# Patient Record
Sex: Female | Born: 1995 | Race: White | Hispanic: No | Marital: Single | Smoking: Former smoker
Health system: Southern US, Community
[De-identification: ages and names within clinical notes are randomized; demographics above are authoritative.]

## PROBLEM LIST (undated history)

## (undated) DIAGNOSIS — F909 Attention-deficit hyperactivity disorder, unspecified type: Secondary | ICD-10-CM

## (undated) DIAGNOSIS — Z9189 Other specified personal risk factors, not elsewhere classified: Secondary | ICD-10-CM

## (undated) DIAGNOSIS — F32A Depression, unspecified: Secondary | ICD-10-CM

## (undated) DIAGNOSIS — F419 Anxiety disorder, unspecified: Secondary | ICD-10-CM

## (undated) DIAGNOSIS — G43909 Migraine, unspecified, not intractable, without status migrainosus: Secondary | ICD-10-CM

## (undated) DIAGNOSIS — F329 Major depressive disorder, single episode, unspecified: Secondary | ICD-10-CM

## (undated) DIAGNOSIS — R569 Unspecified convulsions: Secondary | ICD-10-CM

## (undated) DIAGNOSIS — J45909 Unspecified asthma, uncomplicated: Secondary | ICD-10-CM

## (undated) DIAGNOSIS — F319 Bipolar disorder, unspecified: Secondary | ICD-10-CM

## (undated) HISTORY — DX: Attention-deficit hyperactivity disorder, unspecified type: F90.9

## (undated) HISTORY — DX: Other specified personal risk factors, not elsewhere classified: Z91.89

## (undated) HISTORY — DX: Unspecified convulsions: R56.9

## (undated) HISTORY — DX: Major depressive disorder, single episode, unspecified: F32.9

## (undated) HISTORY — DX: Depression, unspecified: F32.A

## (undated) HISTORY — PX: TONSILLECTOMY AND ADENOIDECTOMY: SUR1326

## (undated) HISTORY — DX: Bipolar disorder, unspecified: F31.9

## (undated) HISTORY — DX: Migraine, unspecified, not intractable, without status migrainosus: G43.909

## (undated) HISTORY — DX: Anxiety disorder, unspecified: F41.9

---
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
## 2016-01-21 ENCOUNTER — Encounter: Payer: Self-pay | Admitting: *Deleted

## 2016-01-21 ENCOUNTER — Emergency Department
Admission: EM | Admit: 2016-01-21 | Discharge: 2016-01-21 | Disposition: A | Discharge status: Home / Self Care | Payer: Self-pay | Attending: Emergency Medicine | Admitting: Emergency Medicine

## 2016-01-21 DIAGNOSIS — F17200 Nicotine dependence, unspecified, uncomplicated: Secondary | ICD-10-CM | POA: Insufficient documentation

## 2016-01-21 DIAGNOSIS — K2960 Other gastritis without bleeding: Secondary | ICD-10-CM | POA: Insufficient documentation

## 2016-01-21 DIAGNOSIS — R1084 Generalized abdominal pain: Secondary | ICD-10-CM

## 2016-01-21 LAB — CBC
HCT: 45.6 % (ref 35.0–47.0)
HEMOGLOBIN: 15.6 g/dL (ref 12.0–16.0)
MCH: 30.9 pg (ref 26.0–34.0)
MCHC: 34.3 g/dL (ref 32.0–36.0)
MCV: 90.2 fL (ref 80.0–100.0)
PLATELETS: 203 10*3/uL (ref 150–440)
RBC: 5.06 MIL/uL (ref 3.80–5.20)
RDW: 13.2 % (ref 11.5–14.5)
WBC: 6.5 10*3/uL (ref 3.6–11.0)

## 2016-01-21 LAB — URINALYSIS COMPLETE WITH MICROSCOPIC (ARMC ONLY)
BILIRUBIN URINE: NEGATIVE
Glucose, UA: NEGATIVE mg/dL
Leukocytes, UA: NEGATIVE
NITRITE: NEGATIVE
PH: 6 (ref 5.0–8.0)
PROTEIN: 30 mg/dL — AB
SPECIFIC GRAVITY, URINE: 1.028 (ref 1.005–1.030)

## 2016-01-21 LAB — COMPREHENSIVE METABOLIC PANEL
ALK PHOS: 75 U/L (ref 38–126)
ALT: 23 U/L (ref 14–54)
ANION GAP: 9 (ref 5–15)
AST: 34 U/L (ref 15–41)
Albumin: 4.3 g/dL (ref 3.5–5.0)
BILIRUBIN TOTAL: 0.4 mg/dL (ref 0.3–1.2)
BUN: 14 mg/dL (ref 6–20)
CALCIUM: 8.9 mg/dL (ref 8.9–10.3)
CO2: 26 mmol/L (ref 22–32)
CREATININE: 0.77 mg/dL (ref 0.44–1.00)
Chloride: 100 mmol/L — ABNORMAL LOW (ref 101–111)
Glucose, Bld: 94 mg/dL (ref 65–99)
Potassium: 3.8 mmol/L (ref 3.5–5.1)
Sodium: 135 mmol/L (ref 135–145)
TOTAL PROTEIN: 7.2 g/dL (ref 6.5–8.1)

## 2016-01-21 LAB — POCT PREGNANCY, URINE: Preg Test, Ur: NEGATIVE

## 2016-01-21 LAB — LIPASE, BLOOD: Lipase: 17 U/L (ref 11–51)

## 2016-01-21 MED ORDER — GI COCKTAIL ~~LOC~~
30.0000 mL | ORAL | Status: AC
  Administered 2016-01-21: 30 mL via ORAL
  Filled 2016-01-21: qty 30

## 2016-01-21 MED ORDER — FAMOTIDINE 20 MG PO TABS
40.0000 mg | ORAL_TABLET | Freq: Once | ORAL | Status: AC
  Administered 2016-01-21: 40 mg via ORAL
  Filled 2016-01-21: qty 2

## 2016-01-21 MED ORDER — ALUMINUM-MAGNESIUM-SIMETHICONE 200-200-20 MG/5ML PO SUSP: 30.0000 mL | Freq: Three times a day (TID) | ORAL | 0 refills | Status: DC

## 2016-01-21 MED ORDER — METOCLOPRAMIDE HCL 10 MG PO TABS: 10.0000 mg | ORAL_TABLET | Freq: Four times a day (QID) | ORAL | 0 refills | Status: DC | PRN

## 2016-01-21 MED ORDER — ONDANSETRON 4 MG PO TBDP
8.0000 mg | ORAL_TABLET | Freq: Once | ORAL | Status: AC
  Administered 2016-01-21: 8 mg via ORAL
  Filled 2016-01-21: qty 2

## 2016-01-21 MED ORDER — KETOROLAC TROMETHAMINE 60 MG/2ML IM SOLN
15.0000 mg | Freq: Once | INTRAMUSCULAR | Status: AC
  Administered 2016-01-21: 15 mg via INTRAMUSCULAR
  Filled 2016-01-21: qty 2

## 2016-01-21 MED ORDER — OXYCODONE-ACETAMINOPHEN 5-325 MG PO TABS
1.0000 | ORAL_TABLET | Freq: Once | ORAL | Status: AC
  Administered 2016-01-21: 1 via ORAL
  Filled 2016-01-21: qty 1

## 2016-01-21 MED ORDER — FAMOTIDINE 20 MG PO TABS: 20.0000 mg | ORAL_TABLET | Freq: Two times a day (BID) | ORAL | 0 refills | Status: DC

## 2016-01-21 NOTE — ED Notes (Signed)
Pt unable to void at this time. 

## 2016-01-21 NOTE — ED Triage Notes (Signed)
Pt to triage via wheelchair.  Pt reports upper abd pain.  Pt states vomited yesterday.  Pt tearful in triage.

## 2016-01-21 NOTE — ED Provider Notes (Signed)
[facility] Emergency Department Provider Note  ____________________________________________  Time seen: Approximately 8:24 PM  I have reviewed the triage vital signs and the nursing notes.   HISTORY  Chief Complaint Abdominal Pain    HPI Chloe Hopkins is a 20 y.o. female who complains of upper abdominal pain with vomiting since yesterday. He reports that she has decreased appetite and unable to eat or drink because of the discomfort. Status post cholecystectomy from several years ago. No fevers or chills. Family members sick with viral illnesses recently. No diarrhea.     No past medical history on file. None  There are no active problems to display for this patient.    No past surgical history on file. Cholecystectomy  Prior to Admission medications   Medication Sig Start Date End Date Taking? Authorizing Provider  aluminum-magnesium hydroxide-simethicone (MAALOX) 200-200-20 MG/5ML SUSP Take 30 mLs by mouth 4 (four) times daily -  before meals and at bedtime. 01/21/16   Sharman CheekPhillip Hermine Feria, MD  famotidine (PEPCID) 20 MG tablet Take 1 tablet (20 mg total) by mouth 2 (two) times daily. 01/21/16   Sharman CheekPhillip Caleesi Kohl, MD  metoCLOPramide (REGLAN) 10 MG tablet Take 1 tablet (10 mg total) by mouth every 6 (six) hours as needed. 01/21/16   Sharman CheekPhillip Jarrel Knoke, MD     Allergies Review of patient's allergies indicates no known allergies.   No family history on file.  Social History Social History  Substance Use Topics  . Smoking status: Current Every Day Smoker  . Smokeless tobacco: Never Used  . Alcohol use No    Review of Systems  Constitutional:   No fever or chills.  ENT:   No sore throat. No rhinorrhea. Cardiovascular:   No chest pain. Respiratory:   No dyspnea or cough. Gastrointestinal:   Positive upper abdominal pain with vomiting. No diarrhea. Genitourinary:   Negative for dysuria or difficulty urinating. 10-point ROS otherwise  negative.  ____________________________________________   PHYSICAL EXAM:  VITAL SIGNS: ED Triage Vitals  Enc Vitals Group     BP 01/21/16 1924 119/77     Pulse Rate 01/21/16 1924 83     Resp 01/21/16 1924 18     Temp 01/21/16 1924 98.2 F (36.8 C)     Temp Source 01/21/16 1924 Oral     SpO2 01/21/16 1924 100 %     Weight 01/21/16 1925 133 lb (60.3 kg)     Height 01/21/16 1925 5' (1.524 m)     Head Circumference --      Peak Flow --      Pain Score 01/21/16 1925 9     Pain Loc --      Pain Edu? --      Excl. in GC? --     Vital signs reviewed, nursing assessments reviewed.   Constitutional:   Alert and oriented. Well appearing and in no distress. Eyes:   No scleral icterus. No conjunctival pallor. PERRL. EOMI.  No nystagmus. ENT   Head:   Normocephalic and atraumatic.   Nose:   No congestion/rhinnorhea. No septal hematoma   Mouth/Throat:   MMM, no pharyngeal erythema. No peritonsillar mass.    Neck:   No stridor. No SubQ emphysema. No meningismus. Hematological/Lymphatic/Immunilogical:   No cervical lymphadenopathy. Cardiovascular:   RRR. Symmetric bilateral radial and DP pulses.  No murmurs.  Respiratory:   Normal respiratory effort without tachypnea nor retractions. Breath sounds are clear and equal bilaterally. No wheezes/rales/rhonchi. Gastrointestinal:   Soft with left upper quadrant and  epigastric tenderness. Non distended. There is no CVA tenderness.  No rebound, rigidity, or guarding. Genitourinary:   deferred Musculoskeletal:   Nontender with normal range of motion in all extremities. No joint effusions.  No lower extremity tenderness.  No edema. Neurologic:   Normal speech and language.  CN 2-10 normal. Motor grossly intact. No gross focal neurologic deficits are appreciated.  Skin:    Skin is warm, dry and intact. No rash noted.  No petechiae, purpura, or bullae.  ____________________________________________    LABS (pertinent  positives/negatives) (all labs ordered are listed, but only abnormal results are displayed) Labs Reviewed  COMPREHENSIVE METABOLIC PANEL - Abnormal; Notable for the following:       Result Value   Chloride 100 (*)    All other components within normal limits  URINALYSIS COMPLETEWITH MICROSCOPIC ([facility] ONLY) - Abnormal; Notable for the following:    Color, Urine YELLOW (*)    APPearance CLOUDY (*)    Ketones, ur TRACE (*)    Hgb urine dipstick 1+ (*)    Protein, ur 30 (*)    Bacteria, UA RARE (*)    Squamous Epithelial / LPF 6-30 (*)    All other components within normal limits  LIPASE, BLOOD  CBC  POC URINE PREG, ED  POCT PREGNANCY, URINE   ____________________________________________   EKG    ____________________________________________    RADIOLOGY    ____________________________________________   PROCEDURES Procedures  ____________________________________________   INITIAL IMPRESSION / ASSESSMENT AND PLAN / ED COURSE  Pertinent labs & imaging results that were available during my care of the patient were reviewed by me and considered in my medical decision making (see chart for details).  Patient presents with upper abdominal pain, likely gastritis, viral in origin. Labs unremarkable. Lipase normal. Urinalysis and pregnancy test negative. Patient given antiemetics, tolerating oral intake. Inadequate pain relief with Toradol, so gave her Percocet to assist with her acute symptoms tonight. GI cocktail Pepcid as well. We'll discharge home with famotidine Maalox and Reglan. Follow up with primary care.   Considering the patient's symptoms, medical history, and physical examination today, I have low suspicion for cholecystitis or biliary pathology, pancreatitis, perforation or bowel obstruction, hernia, intra-abdominal abscess, AAA or dissection, volvulus or intussusception, mesenteric ischemia, or appendicitis.       Clinical Course    ____________________________________________   FINAL CLINICAL IMPRESSION(S) / ED DIAGNOSES  Final diagnoses:  Generalized abdominal pain  Other specified gastritis, presence of bleeding unspecified, unspecified chronicity       Portions of this note were generated with dragon dictation software. Dictation errors may occur despite best attempts at proofreading.    Sharman CheekPhillip Damarys Speir, MD 01/21/16 2155

## 2016-04-20 DIAGNOSIS — R197 Diarrhea, unspecified: Secondary | ICD-10-CM | POA: Diagnosis not present

## 2016-04-20 DIAGNOSIS — J45909 Unspecified asthma, uncomplicated: Secondary | ICD-10-CM | POA: Diagnosis not present

## 2016-04-20 DIAGNOSIS — R1032 Left lower quadrant pain: Secondary | ICD-10-CM | POA: Diagnosis not present

## 2016-04-20 DIAGNOSIS — R112 Nausea with vomiting, unspecified: Secondary | ICD-10-CM | POA: Insufficient documentation

## 2016-04-20 DIAGNOSIS — F17200 Nicotine dependence, unspecified, uncomplicated: Secondary | ICD-10-CM | POA: Insufficient documentation

## 2016-04-20 DIAGNOSIS — R1031 Right lower quadrant pain: Secondary | ICD-10-CM | POA: Diagnosis not present

## 2016-04-20 DIAGNOSIS — N898 Other specified noninflammatory disorders of vagina: Secondary | ICD-10-CM | POA: Diagnosis not present

## 2016-04-20 DIAGNOSIS — K6289 Other specified diseases of anus and rectum: Secondary | ICD-10-CM | POA: Diagnosis not present

## 2016-04-20 NOTE — ED Triage Notes (Signed)
Patient reports abdominal pain and vomiting that started tonight.

## 2016-04-21 ENCOUNTER — Encounter: Payer: Self-pay | Admitting: Radiology

## 2016-04-21 ENCOUNTER — Emergency Department: Payer: BLUE CROSS/BLUE SHIELD

## 2016-04-21 ENCOUNTER — Emergency Department
Admission: EM | Admit: 2016-04-21 | Discharge: 2016-04-21 | Disposition: A | Discharge status: Home / Self Care | Payer: BLUE CROSS/BLUE SHIELD | Attending: Emergency Medicine | Admitting: Emergency Medicine

## 2016-04-21 DIAGNOSIS — R197 Diarrhea, unspecified: Secondary | ICD-10-CM

## 2016-04-21 DIAGNOSIS — R109 Unspecified abdominal pain: Secondary | ICD-10-CM

## 2016-04-21 DIAGNOSIS — R112 Nausea with vomiting, unspecified: Secondary | ICD-10-CM

## 2016-04-21 HISTORY — DX: Unspecified asthma, uncomplicated: J45.909

## 2016-04-21 LAB — COMPREHENSIVE METABOLIC PANEL
ALBUMIN: 4.2 g/dL (ref 3.5–5.0)
ALT: 22 U/L (ref 14–54)
ANION GAP: 8 (ref 5–15)
AST: 27 U/L (ref 15–41)
Alkaline Phosphatase: 57 U/L (ref 38–126)
BUN: 18 mg/dL (ref 6–20)
CALCIUM: 8.7 mg/dL — AB (ref 8.9–10.3)
CHLORIDE: 105 mmol/L (ref 101–111)
CO2: 24 mmol/L (ref 22–32)
Creatinine, Ser: 0.6 mg/dL (ref 0.44–1.00)
GFR calc non Af Amer: >60 mL/min (ref >60)
GLUCOSE: 130 mg/dL — AB (ref 65–99)
POTASSIUM: 4.1 mmol/L (ref 3.5–5.1)
SODIUM: 137 mmol/L (ref 135–145)
Total Bilirubin: 0.7 mg/dL (ref 0.3–1.2)
Total Protein: 7.2 g/dL (ref 6.5–8.1)

## 2016-04-21 LAB — HCG, QUANTITATIVE, PREGNANCY

## 2016-04-21 LAB — URINALYSIS, COMPLETE (UACMP) WITH MICROSCOPIC
Bilirubin Urine: NEGATIVE
GLUCOSE, UA: NEGATIVE mg/dL
KETONES UR: NEGATIVE mg/dL
Nitrite: NEGATIVE
PROTEIN: 100 mg/dL — AB
Specific Gravity, Urine: 1.03 (ref 1.005–1.030)
pH: 5 (ref 5.0–8.0)

## 2016-04-21 LAB — CBC
HEMATOCRIT: 43.3 % (ref 35.0–47.0)
HEMOGLOBIN: 15.1 g/dL (ref 12.0–16.0)
MCH: 30.7 pg (ref 26.0–34.0)
MCHC: 34.9 g/dL (ref 32.0–36.0)
MCV: 87.8 fL (ref 80.0–100.0)
Platelets: 219 10*3/uL (ref 150–440)
RBC: 4.93 MIL/uL (ref 3.80–5.20)
RDW: 13.2 % (ref 11.5–14.5)
WBC: 16.4 10*3/uL — ABNORMAL HIGH (ref 3.6–11.0)

## 2016-04-21 LAB — WET PREP, GENITAL
SPERM: NONE SEEN
TRICH WET PREP: NONE SEEN
YEAST WET PREP: NONE SEEN

## 2016-04-21 LAB — CHLAMYDIA/NGC RT PCR (ARMC ONLY)
Chlamydia Tr: NOT DETECTED
N gonorrhoeae: NOT DETECTED

## 2016-04-21 LAB — POCT PREGNANCY, URINE: Preg Test, Ur: NEGATIVE

## 2016-04-21 LAB — LIPASE, BLOOD: LIPASE: 25 U/L (ref 11–51)

## 2016-04-21 MED ORDER — SODIUM CHLORIDE 0.9 % IV BOLUS (SEPSIS)
1000.0000 mL | Freq: Once | INTRAVENOUS | Status: AC
  Administered 2016-04-21: 1000 mL via INTRAVENOUS

## 2016-04-21 MED ORDER — IOPAMIDOL (ISOVUE-300) INJECTION 61%
30.0000 mL | Freq: Once | INTRAVENOUS | Status: AC
  Administered 2016-04-21: 30 mL via ORAL

## 2016-04-21 MED ORDER — MORPHINE SULFATE (PF) 4 MG/ML IV SOLN
4.0000 mg | Freq: Once | INTRAVENOUS | Status: AC
  Administered 2016-04-21: 4 mg via INTRAVENOUS
  Filled 2016-04-21: qty 1

## 2016-04-21 MED ORDER — ONDANSETRON 4 MG PO TBDP
4.0000 mg | ORAL_TABLET | Freq: Once | ORAL | Status: AC
  Administered 2016-04-21: 4 mg via ORAL
  Filled 2016-04-21: qty 1

## 2016-04-21 MED ORDER — ONDANSETRON 4 MG PO TBDP
4.0000 mg | ORAL_TABLET | Freq: Once | ORAL | Status: AC
  Administered 2016-04-21: 4 mg via ORAL

## 2016-04-21 MED ORDER — ONDANSETRON 4 MG PO TBDP
ORAL_TABLET | ORAL | Status: AC
  Filled 2016-04-21: qty 1

## 2016-04-21 MED ORDER — ONDANSETRON HCL 4 MG PO TABS: 4.0000 mg | ORAL_TABLET | Freq: Three times a day (TID) | ORAL | 0 refills | Status: DC | PRN

## 2016-04-21 MED ORDER — IOPAMIDOL (ISOVUE-300) INJECTION 61%
100.0000 mL | Freq: Once | INTRAVENOUS | Status: AC | PRN
  Administered 2016-04-21: 100 mL via INTRAVENOUS

## 2016-04-21 MED ORDER — OXYCODONE-ACETAMINOPHEN 5-325 MG PO TABS
1.0000 | ORAL_TABLET | Freq: Once | ORAL | Status: AC
Start: 2016-04-21 — End: 2016-04-21
  Administered 2016-04-21: 1 via ORAL
  Filled 2016-04-21: qty 1

## 2016-04-21 NOTE — ED Notes (Signed)
Pt vomiting in lobby; given Zofran as allowed per protocol

## 2016-04-21 NOTE — ED Notes (Signed)
Pt nauseous, Dr Alphonzo LemmingsMcShane notified, Clydie BraunKaren, CT called to scan with one bottle drank  Mother going home, Roma Theda BelfastChildress [phone]

## 2016-04-21 NOTE — ED Provider Notes (Signed)
[facility] Emergency Department Provider Note  ____________________________________________   I have reviewed the triage vital signs and the nursing notes.   HISTORY  Chief Complaint Abdominal Pain and Emesis    HPI Chloe Hopkins is a 21 y.o. female who present today complaining of lower abdominal pain. Began this evening with vomiting. Has not had loose stools. The cramping diffuse discomfort. More on the right than the left. Denies vaginal discharge. On her menstrual period right now. Denies pregnancy. Has not had sexual relations in over a month. No fever. Anus crampy and diffuse but more on the right than the left. She is on her menstrual period now. It is a normal menstrual period.     Past Medical History:  Diagnosis Date  . Asthma     There are no active problems to display for this patient.   No past surgical history on file.  Prior to Admission medications   Medication Sig Start Date End Date Taking? Authorizing Provider  aluminum-magnesium hydroxide-simethicone (MAALOX) 200-200-20 MG/5ML SUSP Take 30 mLs by mouth 4 (four) times daily -  before meals and at bedtime. Patient not taking: Reported on 04/21/2016 01/21/16   Sharman CheekPhillip Stafford, MD  famotidine (PEPCID) 20 MG tablet Take 1 tablet (20 mg total) by mouth 2 (two) times daily. Patient not taking: Reported on 04/21/2016 01/21/16   Sharman CheekPhillip Stafford, MD    Allergies Patient has no known allergies.  No family history on file.  Social History Social History  Substance Use Topics  . Smoking status: Current Every Day Smoker  . Smokeless tobacco: Never Used  . Alcohol use No    Review of Systems Constitutional: No fever/chills Eyes: No visual changes. ENT: No sore throat. No stiff neck no neck pain Cardiovascular: Denies chest pain. Respiratory: Denies shortness of breath. Gastrointestinal:   As above vomiting.  No diarrhea.  No constipation. Genitourinary: Negative for  dysuria. Musculoskeletal: Negative lower extremity swelling Skin: Negative for rash. Neurological: Negative for severe headaches, focal weakness or numbness. 10-point ROS otherwise negative.  ____________________________________________   PHYSICAL EXAM:  VITAL SIGNS: ED Triage Vitals  Enc Vitals Group     BP 04/20/16 2350 108/64     Pulse Rate 04/20/16 2350 (!) 103     Resp 04/20/16 2350 20     Temp 04/20/16 2350 98.2 F (36.8 C)     Temp src --      SpO2 04/20/16 2350 97 %     Weight 04/20/16 2348 141 lb (64 kg)     Height 04/20/16 2348 5\' 1"  (1.549 m)     Head Circumference --      Peak Flow --      Pain Score 04/20/16 2350 8     Pain Loc --      Pain Edu? --      Excl. in GC? --     Constitutional: Alert and oriented. Well appearing and in no acute distress. Eyes: Conjunctivae are normal. PERRL. EOMI. Head: Atraumatic. Nose: No congestion/rhinnorhea. Mouth/Throat: Mucous membranes are moist.  Oropharynx non-erythematous. Neck: No stridor.   Nontender with no meningismus Cardiovascular: Normal rate, regular rhythm. Grossly normal heart sounds.  Good peripheral circulation. Respiratory: Normal respiratory effort.  No retractions. Lungs CTAB. Abdominal: Soft and Tender noted to the right lower quadrant around McBurney's point no guarding or rebound No distention. No guarding no rebound Back:  There is no focal tenderness or step off.  there is no midline tenderness there are no lesions noted.  there is no CVA tenderness Pelvic exam: Female nurse chaperone present, no external lesions noted, physiologic vaginal discharge noted with no purulent discharge, no cervical motion tenderness, no adnexal tenderness or mass, there is no significant uterine tenderness or mass. No vaginal bleeding Musculoskeletal: No lower extremity tenderness, no upper extremity tenderness. No joint effusions, no DVT signs strong distal pulses no edema Neurologic:  Normal speech and language. No gross  focal neurologic deficits are appreciated.  Skin:  Skin is warm, dry and intact. No rash noted. Psychiatric: Mood and affect are normal. Speech and behavior are normal.  ____________________________________________   LABS (all labs ordered are listed, but only abnormal results are displayed)  Labs Reviewed  COMPREHENSIVE METABOLIC PANEL - Abnormal; Notable for the following:       Result Value   Glucose, Bld 130 (*)    Calcium 8.7 (*)    All other components within normal limits  CBC - Abnormal; Notable for the following:    WBC 16.4 (*)    All other components within normal limits  URINALYSIS, COMPLETE (UACMP) WITH MICROSCOPIC - Abnormal; Notable for the following:    Color, Urine AMBER (*)    APPearance CLEAR (*)    Hgb urine dipstick LARGE (*)    Protein, ur 100 (*)    Leukocytes, UA TRACE (*)    Bacteria, UA RARE (*)    Squamous Epithelial / LPF 0-5 (*)    All other components within normal limits  CHLAMYDIA/NGC RT PCR ([facility] ONLY)  URINE CULTURE  WET PREP, GENITAL  LIPASE, BLOOD  HCG, QUANTITATIVE, PREGNANCY  POC URINE PREG, ED  POCT PREGNANCY, URINE   ____________________________________________  EKG  I personally interpreted any EKGs ordered by me or triage  ____________________________________________  RADIOLOGY  I reviewed any imaging ordered by me or triage that were performed during my shift and, if possible, patient and/or family made aware of any abnormal findings. ____________________________________________   PROCEDURES  Procedure(s) performed: None  Procedures  Critical Care performed: None  ____________________________________________   INITIAL IMPRESSION / ASSESSMENT AND PLAN / ED COURSE  Pertinent labs & imaging results that were available during my care of the patient were reviewed by me and considered in my medical decision making (see chart for details).  When a nonsurgical abdomen however had focal abdominal pain at  McBurney's point with a white count and nausea. We obtained a CT scan to rule out appendicitis. That was reassuringly negative for possible viral pathology noted. Exam shows no evidence of PID. Wet prep is pending. She has a tiny ovarian cyst which I do not think is likely the cause of her symptoms although it certainly could be no evidence of torsion with a cyst seen on CT of less than a centimeter. Patient would prefer not to have an ultrasound at this time. If her pelvic exam results are reassuring in terms of what prep we'll discharge her home with close outpatient follow-up. Most likely this is early viral gastroenteritis. Abdomen completely nontender and benign at this time.    ____________________________________________   FINAL CLINICAL IMPRESSION(S) / ED DIAGNOSES  Final diagnoses:  None      This chart was dictated using voice recognition software.  Despite best efforts to proofread,  errors can occur which can change meaning.      Jeanmarie Plant, MD 04/21/16 [phone]

## 2016-04-22 LAB — URINE CULTURE

## 2016-05-03 ENCOUNTER — Encounter: Payer: Self-pay | Admitting: Physician Assistant

## 2016-05-03 ENCOUNTER — Ambulatory Visit (INDEPENDENT_AMBULATORY_CARE_PROVIDER_SITE_OTHER): Payer: BLUE CROSS/BLUE SHIELD | Admitting: Physician Assistant

## 2016-05-03 VITALS — BP 110/82 | HR 92 | Temp 97.9°F | Resp 16 | Ht 61.0 in | Wt 143.0 lb

## 2016-05-03 DIAGNOSIS — Z7689 Persons encountering health services in other specified circumstances: Secondary | ICD-10-CM

## 2016-05-03 DIAGNOSIS — F3112 Bipolar disorder, current episode manic without psychotic features, moderate: Secondary | ICD-10-CM

## 2016-05-03 DIAGNOSIS — N83201 Unspecified ovarian cyst, right side: Secondary | ICD-10-CM

## 2016-05-03 MED ORDER — LAMOTRIGINE 25 MG PO TABS: 25.0000 mg | ORAL_TABLET | Freq: Every day | ORAL | 0 refills | Status: DC

## 2016-05-03 NOTE — Progress Notes (Signed)
Patient: Chloe Hopkins Female    DOB: [birth year]   21 y.o.   MRN: [id number] Visit Date: 05/03/2016  Today's Provider: Margaretann LovelessJennifer M Baylen Dea, PA-C   Chief Complaint  Patient presents with  . New Patient (Initial Visit)  . ADHD   Subjective:    HPI Patient here today to establish care. Patient recently moved from [place]. Patient's mother reports that pt is UTD on all vaccines.  [facility]/21/16 for abdominal pain. Patient had a CT abdomen pelivs w/ contrast done. Patient was told to have a cyst on one of her ovaries and to fu with PCP.  Patient here with mother today. Patient's mother reports that patient was on Bipolar medication Pristiq 50 mg BID for about 4 years. She has been off medications for a while now. She and her mother both report she is having more frequent swings now. She reports being in manic states more than depressed states at this time.     No Known Allergies  No current outpatient prescriptions on file.  Review of Systems  Constitutional: Positive for activity change and fatigue. Negative for fever.  HENT: Negative.   Eyes: Negative.   Respiratory: Negative.   Cardiovascular: Negative.   Gastrointestinal: Negative.   Endocrine: Negative.   Genitourinary: Negative.   Musculoskeletal: Negative.   Skin: Negative.   Allergic/Immunologic: Negative.   Neurological: Positive for headaches. Negative for dizziness and light-headedness.  Hematological: Negative.   Psychiatric/Behavioral: Positive for agitation, behavioral problems and decreased concentration. Negative for confusion, dysphoric mood, hallucinations, self-injury, sleep disturbance and suicidal ideas. The patient is hyperactive. The patient is not nervous/anxious.     Social History  Substance Use Topics  . Smoking status: Current Every Day Smoker  . Smokeless tobacco: Never Used  . Alcohol use No   Objective:   BP 110/82 (BP Location: Left Arm, Patient Position:  Sitting, Cuff Size: Normal)   Pulse 92   Temp 97.9 F (36.6 C) (Oral)   Resp 16   Ht 5\' 1"  (1.549 m)   Wt 143 lb (64.9 kg)   LMP 04/20/2016 (Exact Date) Comment: neg preg test today  BMI 27.02 kg/m   Physical Exam  Constitutional: She is oriented to person, place, and time. She appears well-developed and well-nourished. No distress.  HENT:  Head: Normocephalic and atraumatic.  Right Ear: Tympanic membrane, external ear and ear canal normal.  Left Ear: Tympanic membrane, external ear and ear canal normal.  Nose: Nose normal.  Mouth/Throat: Uvula is midline, oropharynx is clear and moist and mucous membranes are normal. No oropharyngeal exudate.  Eyes: Conjunctivae and EOM are normal. Pupils are equal, round, and reactive to light. Right eye exhibits no discharge. Left eye exhibits no discharge. No scleral icterus.  Neck: Normal range of motion. Neck supple. No JVD present. No tracheal deviation present. No thyromegaly present.  Cardiovascular: Normal rate, regular rhythm, normal heart sounds and intact distal pulses.  Exam reveals no gallop and no friction rub.   No murmur heard. Pulmonary/Chest: Effort normal and breath sounds normal. No respiratory distress. She has no wheezes. She has no rales. She exhibits no tenderness.  Abdominal: Soft. Bowel sounds are normal. She exhibits no distension and no mass. There is no tenderness. There is no rebound and no guarding.  Musculoskeletal: Normal range of motion. She exhibits no edema or tenderness.  Lymphadenopathy:    She has no cervical adenopathy.  Neurological: She is alert and oriented  to person, place, and time.  Skin: Skin is warm and dry. No rash noted. She is not diaphoretic.  Psychiatric: She has a normal mood and affect. Judgment and thought content normal. Her speech is rapid and/or pressured. She is hyperactive. Cognition and memory are normal.  Vitals reviewed.      Assessment & Plan:     1. Establishing care with new  doctor, encounter for [place] from [place].  2. Bipolar 1 disorder with moderate mania (HCC) Currently not on any medications. Reports she has been on SSRIs and Abilify. Her and her mother both report, however, that Abilify was never used concurrently, she was always on it alone. Patient does not wish to go on an SSRI/SNRI at this time. Will try lamictal as below. Will start low and titrate up slowly as tolerated. I will see her back in 4 weeks. - lamoTRIgine (LAMICTAL) 25 MG tablet; Take 1 tablet (25 mg total) by mouth daily. Increase to 2 tabs PO daily after 2 weeks  Dispense: 60 tablet; Refill: 0  3. Right ovarian cyst Noted on imaging in ER. Will get pelvic and transvaginal US. Will f/u pending these results.  - US Pelvis Complete; Future - US Transvaginal Non-OB; Future       Margaretann Loveless, PA-C  [facility]

## 2016-05-03 NOTE — Patient Instructions (Signed)
Lamotrigine tablets What is this medicine? LAMOTRIGINE (la MOE tri jeen) is used to control seizures in adults and children with epilepsy and Lennox-Gastaut syndrome. It is also used in adults to treat bipolar disorder. This medicine may be used for other purposes; ask your health care provider or pharmacist if you have questions. COMMON BRAND NAME(S): Lamictal What should I tell my health care provider before I take this medicine? They need to know if you have any of these conditions: -a history of depression or bipolar disorder -aseptic meningitis during prior use of lamotrigine -folate deficiency -kidney disease -liver disease -suicidal thoughts, plans, or attempt; a previous suicide attempt by you or a family member -an unusual or allergic reaction to lamotrigine or other seizure medications, other medicines, foods, dyes, or preservatives -pregnant or trying to get pregnant -breast-feeding How should I use this medicine? Take this medicine by mouth with a glass of water. Follow the directions on the prescription label. Do not chew these tablets. If this medicine upsets your stomach, take it with food or milk. Take your doses at regular intervals. Do not take your medicine more often than directed. A special MedGuide will be given to you by the pharmacist with each new prescription and refill. Be sure to read this information carefully each time. Talk to your pediatrician regarding the use of this medicine in children. While this drug may be prescribed for children as young as 2 years for selected conditions, precautions do apply. Overdosage: If you think you have taken too much of this medicine contact a poison control center or emergency room at once. NOTE: This medicine is only for you. Do not share this medicine with others. What if I miss a dose? If you miss a dose, take it as soon as you can. If it is almost time for your next dose, take only that dose. Do not take double or extra  doses. What may interact with this medicine? -carbamazepine -female hormones, including contraceptive or birth control pills -methotrexate -phenobarbital -phenytoin -primidone -pyrimethamine -rifampin -trimethoprim -valproic acid This list may not describe all possible interactions. Give your health care provider a list of all the medicines, herbs, non-prescription drugs, or dietary supplements you use. Also tell them if you smoke, drink alcohol, or use illegal drugs. Some items may interact with your medicine. What should I watch for while using this medicine? Visit your doctor or health care professional for regular checks on your progress. If you take this medicine for seizures, wear a Medic Alert bracelet or necklace. Carry an identification card with information about your condition, medicines, and doctor or health care professional. It is important to take this medicine exactly as directed. When first starting treatment, your dose will need to be adjusted slowly. It may take weeks or months before your dose is stable. You should contact your doctor or health care professional if your seizures get worse or if you have any new types of seizures. Do not stop taking this medicine unless instructed by your doctor or health care professional. Stopping your medicine suddenly can increase your seizures or their severity. Contact your doctor or health care professional right away if you develop a rash while taking this medicine. Rashes may be very severe and sometimes require treatment in the hospital. Deaths from rashes have occurred. Serious rashes occur more often in children than adults taking this medicine. It is more common for these serious rashes to occur during the first 2 months of treatment, but a rash can   occur at any time. You may get drowsy, dizzy, or have blurred vision. Do not drive, use machinery, or do anything that needs mental alertness until you know how this medicine affects you.  To reduce dizzy or fainting spells, do not sit or stand up quickly, especially if you are an older patient. Alcohol can increase drowsiness and dizziness. Avoid alcoholic drinks. If you are taking this medicine for bipolar disorder, it is important to report any changes in your mood to your doctor or health care professional. If your condition gets worse, you get mentally depressed, feel very hyperactive or manic, have difficulty sleeping, or have thoughts of hurting yourself or committing suicide, you need to get help from your health care professional right away. If you are a caregiver for someone taking this medicine for bipolar disorder, you should also report these behavioral changes right away. The use of this medicine may increase the chance of suicidal thoughts or actions. Pay special attention to how you are responding while on this medicine. Your mouth may get dry. Chewing sugarless gum or sucking hard candy, and drinking plenty of water may help. Contact your doctor if the problem does not go away or is severe. Women who become pregnant while using this medicine may enroll in the [place] Antiepileptic Drug Pregnancy Registry by calling 1-888-233-2334. This registry collects information about the safety of antiepileptic drug use during pregnancy. What side effects may I notice from receiving this medicine? Side effects that you should report to your doctor or health care professional as soon as possible: -allergic reactions like skin rash, itching or hives, swelling of the face, lips, or tongue -blurred or double vision -difficulty walking or controlling muscle movements -fever -headache, stiff neck, and sensitivity to light -painful sores in the mouth, eyes, or nose -redness, blistering, peeling or loosening of the skin, including inside the mouth -severe muscle pain -swollen lymph glands -uncontrollable eye movements -unusual bruising or bleeding -unusually weak or  tired -vomiting -worsening of mood, thoughts or actions of suicide or dying -yellowing of the eyes or skin Side effects that usually do not require medical attention (report to your doctor or health care professional if they continue or are bothersome): -diarrhea or constipation -difficulty sleeping -nausea -tremors This list may not describe all possible side effects. Call your doctor for medical advice about side effects. You may report side effects to FDA at 1-800-FDA-1088. Where should I keep my medicine? Keep out of reach of children. Store at room temperature between 15 and 30 degrees C (59 and 86 degrees F). Throw away any unused medicine after the expiration date. NOTE: This sheet is a summary. It may not cover all possible information. If you have questions about this medicine, talk to your doctor, pharmacist, or health care provider.  2017 Elsevier/Gold Standard (2015-04-13 09:29:40)  

## 2016-05-08 ENCOUNTER — Ambulatory Visit: Payer: BLUE CROSS/BLUE SHIELD

## 2016-05-10 ENCOUNTER — Ambulatory Visit
Admission: RE | Admit: 2016-05-10 | Discharge: 2016-05-10 | Disposition: A | Discharge status: Home / Self Care | Payer: BLUE CROSS/BLUE SHIELD | Source: Ambulatory Visit | Attending: Physician Assistant | Admitting: Physician Assistant

## 2016-05-10 ENCOUNTER — Telehealth: Payer: Self-pay

## 2016-05-10 DIAGNOSIS — N83201 Unspecified ovarian cyst, right side: Secondary | ICD-10-CM | POA: Diagnosis not present

## 2016-05-10 NOTE — Telephone Encounter (Signed)
Patient has been advised. KW 

## 2016-05-10 NOTE — Telephone Encounter (Signed)
-----   Message from Margaretann LovelessJennifer M Burnette, [place]PA-C sent at 05/10/2016  4:17 PM EST ----- Cyst noted on the left ovary. It is recommended to follow up with repeat US in 6-12 weeks. Scheduling is important so we need to schedule the week following your menstrual cycle in that time frame.

## 2016-05-24 ENCOUNTER — Telehealth: Payer: Self-pay

## 2016-05-24 DIAGNOSIS — N83201 Unspecified ovarian cyst, right side: Secondary | ICD-10-CM

## 2016-05-24 NOTE — Telephone Encounter (Signed)
Pt is going is due to have a repeat U/S of her ovaries.  Please call her at [phone] when scheduled.   Thanks,   -Vernona RiegerLaura

## 2016-05-24 NOTE — Telephone Encounter (Signed)
Orders placed and noted it was f/u for week after menstrual cycle.

## 2016-05-27 ENCOUNTER — Ambulatory Visit
Admission: RE | Admit: 2016-05-27 | Discharge: 2016-05-27 | Disposition: A | Discharge status: Home / Self Care | Payer: BLUE CROSS/BLUE SHIELD | Source: Ambulatory Visit | Attending: Physician Assistant | Admitting: Physician Assistant

## 2016-05-27 DIAGNOSIS — N83201 Unspecified ovarian cyst, right side: Secondary | ICD-10-CM | POA: Diagnosis present

## 2016-05-28 ENCOUNTER — Telehealth: Payer: Self-pay

## 2016-05-28 NOTE — Telephone Encounter (Signed)
-----   Message from Margaretann LovelessJennifer M Burnette, PA-C sent at 05/28/2016  8:31 AM EST ----- Normal ultrasound. Left ovarian cystic structure from last exam has resolved as expected.

## 2016-05-28 NOTE — Telephone Encounter (Signed)
Patient advised as below.  

## 2016-05-31 ENCOUNTER — Ambulatory Visit (INDEPENDENT_AMBULATORY_CARE_PROVIDER_SITE_OTHER): Payer: BLUE CROSS/BLUE SHIELD | Admitting: Physician Assistant

## 2016-05-31 ENCOUNTER — Encounter: Payer: Self-pay | Admitting: Physician Assistant

## 2016-05-31 VITALS — BP 100/60 | HR 87 | Temp 97.6°F | Resp 16 | Wt 143.6 lb

## 2016-05-31 DIAGNOSIS — K529 Noninfective gastroenteritis and colitis, unspecified: Secondary | ICD-10-CM | POA: Diagnosis not present

## 2016-05-31 DIAGNOSIS — F3131 Bipolar disorder, current episode depressed, mild: Secondary | ICD-10-CM | POA: Diagnosis not present

## 2016-05-31 DIAGNOSIS — R110 Nausea: Secondary | ICD-10-CM | POA: Diagnosis not present

## 2016-05-31 MED ORDER — LAMOTRIGINE 100 MG PO TABS: ORAL_TABLET | ORAL | 5 refills | Status: DC

## 2016-05-31 MED ORDER — PROMETHAZINE HCL 25 MG PO TABS: 25.0000 mg | ORAL_TABLET | Freq: Three times a day (TID) | ORAL | 0 refills | Status: DC | PRN

## 2016-05-31 MED ORDER — PROMETHAZINE HCL 25 MG/ML IJ SOLN: 25.0000 mg | Freq: Once | INTRAMUSCULAR | Status: DC

## 2016-05-31 MED ORDER — PROMETHAZINE HCL 25 MG/ML IJ SOLN
25.0000 mg | Freq: Once | INTRAMUSCULAR | Status: AC
  Administered 2016-05-31: 25 mg via INTRAMUSCULAR

## 2016-05-31 NOTE — Patient Instructions (Signed)

## 2016-05-31 NOTE — Progress Notes (Signed)
Patient: Chloe Hopkins Female    DOB: 12/13/1995   21 y.o.   MRN: [id number] Visit Date: 05/31/2016  Today's Provider: Margaretann Loveless, PA-C   Chief Complaint  Patient presents with  . Follow-up    Bipolar Disorder and ovarian cyst  . Diarrhea  . Emesis   Subjective:    HPI Bipolar 1 disorder with moderate mania: Patient is here today for 4 week follow-up. Lamictal was started for patient LOV.she reports she is doing good, still having the mood swings.She is taking 2 tablets in the morning but feels that the medication needs to be increase. Reports that the medication wears off around 12-1 pm.  Ovarian Cyst: Noted on imaging in ER on 04/21/16. On 05/13/15 Cyst noted on the left ovary. It is recommended to follow up with repeat US in 6-12 weeks. Repeated US was ordered and done on 03/05 and ultrasound was normal. Left ovarian cystic structure from last exam has resolved as expected.  She also complaining about vomiting and diarrhea that started yesterday. She can't keep any food down.She has been able to keep the sprite down today. She has been around sick contacts.    Depression screen PHQ 2/9 05/31/2016  Decreased Interest 3  Down, Depressed, Hopeless 2  PHQ - 2 Score 5  Altered sleeping 0  Tired, decreased energy 3  Change in appetite 3  Feeling bad or failure about yourself  2  Trouble concentrating 1  Moving slowly or fidgety/restless 3  Suicidal thoughts 1  PHQ-9 Score 18  Difficult doing work/chores Extremely dIfficult      No Known Allergies   Current Outpatient Prescriptions:  .  lamoTRIgine (LAMICTAL) 25 MG tablet, Take 1 tablet (25 mg total) by mouth daily. Increase to 2 tabs PO daily after 2 weeks, Disp: 60 tablet, Rfl: 0  Review of Systems  Constitutional: Negative.   Respiratory: Negative.   Cardiovascular: Negative for chest pain, palpitations and leg swelling.  Gastrointestinal: Positive for abdominal pain, diarrhea, nausea and vomiting.    Neurological: Negative.   Psychiatric/Behavioral: Positive for behavioral problems and decreased concentration. The patient is hyperactive. The patient is not nervous/anxious.     Social History  Substance Use Topics  . Smoking status: Current Every Day Smoker  . Smokeless tobacco: Never Used  . Alcohol use No   Objective:   BP 100/60 (BP Location: Left Arm, Patient Position: Sitting, Cuff Size: Normal)   Pulse 87   Temp 97.6 F (36.4 C) (Oral)   Resp 16   Wt 143 lb 9.6 oz (65.1 kg)   SpO2 96%   BMI 27.13 kg/m    Physical Exam  Constitutional: She is oriented to person, place, and time. She appears well-developed and well-nourished. No distress.  Cardiovascular: Normal rate, regular rhythm and normal heart sounds.  Exam reveals no gallop and no friction rub.   No murmur heard. Pulmonary/Chest: Effort normal and breath sounds normal. No respiratory distress. She has no wheezes. She has no rales.  Abdominal: Soft. Normal appearance and bowel sounds are normal. She exhibits no distension and no mass. There is no hepatosplenomegaly. There is generalized tenderness. There is no rebound, no guarding and no CVA tenderness.  Neurological: She is alert and oriented to person, place, and time.  Skin: Skin is warm and dry. She is not diaphoretic.  Psychiatric: Her behavior is normal. Thought content normal. Her mood appears anxious. Her affect is blunt. Her speech is rapid and/or pressured. Cognition  and memory are normal. She expresses impulsivity.      Assessment & Plan:     1. Bipolar 1 disorder, depressed, mild (HCC) Increased lamictal to 100mg  daily x 1 week then increase to 200mg  daily. I will see her back in 4-6 weeks to see how she is doing at max dose. - lamoTRIgine (LAMICTAL) 100 MG tablet; Take one tab PO daily x 1 week, then increase to 2 tabs PO daily  Dispense: 60 tablet; Refill: 5  2. Gastroenteritis Phenrgan 25mg  given IM today in the office. She tolerated well. Rx for  phenergan sent to pharmacy. Advised to push fluids and stay well hydrated and get plenty of rest. She is to call if symptoms do not improve or if they worsen. - promethazine (PHENERGAN) 25 MG tablet; Take 1 tablet (25 mg total) by mouth every 8 (eight) hours as needed for nausea or vomiting.  Dispense: 20 tablet; Refill: 0 - promethazine (PHENERGAN) injection 25 mg; Inject 1 mL (25 mg total) into the muscle once.  3. Nausea See above medical treatment plan. - promethazine (PHENERGAN) injection 25 mg; Inject 1 mL (25 mg total) into the muscle once.       Margaretann LovelessJennifer M Burnette, PA-C  [facility]

## 2016-08-14 ENCOUNTER — Encounter: Payer: Self-pay | Admitting: Emergency Medicine

## 2016-08-14 ENCOUNTER — Emergency Department
Admission: EM | Admit: 2016-08-14 | Discharge: 2016-08-14 | Disposition: A | Discharge status: Home / Self Care | Payer: BLUE CROSS/BLUE SHIELD | Attending: Emergency Medicine | Admitting: Emergency Medicine

## 2016-08-14 DIAGNOSIS — R1030 Lower abdominal pain, unspecified: Secondary | ICD-10-CM | POA: Insufficient documentation

## 2016-08-14 DIAGNOSIS — J45909 Unspecified asthma, uncomplicated: Secondary | ICD-10-CM | POA: Insufficient documentation

## 2016-08-14 DIAGNOSIS — Z5321 Procedure and treatment not carried out due to patient leaving prior to being seen by health care provider: Secondary | ICD-10-CM | POA: Insufficient documentation

## 2016-08-14 DIAGNOSIS — F17200 Nicotine dependence, unspecified, uncomplicated: Secondary | ICD-10-CM | POA: Insufficient documentation

## 2016-08-14 LAB — COMPREHENSIVE METABOLIC PANEL
ALT: 14 U/L (ref 14–54)
AST: 18 U/L (ref 15–41)
Albumin: 4 g/dL (ref 3.5–5.0)
Alkaline Phosphatase: 57 U/L (ref 38–126)
Anion gap: 6 (ref 5–15)
BILIRUBIN TOTAL: 0.5 mg/dL (ref 0.3–1.2)
BUN: 12 mg/dL (ref 6–20)
CALCIUM: 9 mg/dL (ref 8.9–10.3)
CO2: 26 mmol/L (ref 22–32)
CREATININE: 0.62 mg/dL (ref 0.44–1.00)
Chloride: 107 mmol/L (ref 101–111)
Glucose, Bld: 93 mg/dL (ref 65–99)
Potassium: 4.3 mmol/L (ref 3.5–5.1)
Sodium: 139 mmol/L (ref 135–145)
TOTAL PROTEIN: 6.7 g/dL (ref 6.5–8.1)

## 2016-08-14 LAB — URINALYSIS, COMPLETE (UACMP) WITH MICROSCOPIC
Bilirubin Urine: NEGATIVE
GLUCOSE, UA: NEGATIVE mg/dL
Ketones, ur: NEGATIVE mg/dL
LEUKOCYTES UA: NEGATIVE
NITRITE: NEGATIVE
PROTEIN: NEGATIVE mg/dL
Specific Gravity, Urine: 1.015 (ref 1.005–1.030)
pH: 7 (ref 5.0–8.0)

## 2016-08-14 LAB — CBC
HCT: 45.6 % (ref 35.0–47.0)
Hemoglobin: 16 g/dL (ref 12.0–16.0)
MCH: 31 pg (ref 26.0–34.0)
MCHC: 35.1 g/dL (ref 32.0–36.0)
MCV: 88.4 fL (ref 80.0–100.0)
PLATELETS: 207 10*3/uL (ref 150–440)
RBC: 5.15 MIL/uL (ref 3.80–5.20)
RDW: 13.6 % (ref 11.5–14.5)
WBC: 9.2 10*3/uL (ref 3.6–11.0)

## 2016-08-14 LAB — POCT PREGNANCY, URINE: PREG TEST UR: NEGATIVE

## 2016-08-14 LAB — LIPASE, BLOOD: LIPASE: 19 U/L (ref 11–51)

## 2016-08-14 NOTE — ED Triage Notes (Signed)
Pt comes into the ED via POV c/o abdominal pain and nausea since Monday.  Patient states the pain is in the lower abdomen.  She has yet to have her period this month but states she is still having the symptoms.  Patient in NAD at this time with even and unlabored respirations.  Denies any diarrhea, chest pain, or shortness of breath.

## 2016-08-14 NOTE — ED Notes (Signed)
Pt reports her mother has to go to work and she has to leave. Pt advised to follow up with PCP or back to ER if no improvement in sx's. Pt ambulatory and in no distress at this time.

## 2016-09-11 ENCOUNTER — Ambulatory Visit (INDEPENDENT_AMBULATORY_CARE_PROVIDER_SITE_OTHER): Payer: Self-pay | Admitting: Physician Assistant

## 2016-09-11 ENCOUNTER — Encounter: Payer: Self-pay | Admitting: Physician Assistant

## 2016-09-11 VITALS — BP 112/72 | HR 108 | Temp 98.5°F | Resp 16 | Ht 61.0 in | Wt 146.0 lb

## 2016-09-11 DIAGNOSIS — F3112 Bipolar disorder, current episode manic without psychotic features, moderate: Secondary | ICD-10-CM

## 2016-09-11 DIAGNOSIS — F4310 Post-traumatic stress disorder, unspecified: Secondary | ICD-10-CM

## 2016-09-11 DIAGNOSIS — G479 Sleep disorder, unspecified: Secondary | ICD-10-CM

## 2016-09-11 MED ORDER — ALPRAZOLAM 0.5 MG PO TABS: 0.5000 mg | ORAL_TABLET | Freq: Every evening | ORAL | 1 refills | Status: DC | PRN

## 2016-09-11 NOTE — Patient Instructions (Signed)
Posttraumatic Stress Disorder Posttraumatic stress disorder (PTSD) is a mental health disorder that can occur after a traumatic event, such as a threat to life, serious injury, or sexual violence. Some people who experience these types of events may develop PTSD. Sometimes, PTSD can occur in people who hear about trauma that occurs to a close family member or friend. PTSD can happen to anyone at any age. What increases the risk? This condition is more likely to occur in:  Engineer, manufacturing.  People who have been the victims of, or witness to, a traumatic event, such as: ? Domestic violence. ? Childhood physical or sexual abuse. ? Rape. ? Natural disasters. ? Accidents involving serious injury.  What are the signs or symptoms? Symptoms of PTSD can be grouped into several categories: intrusive, avoidance, increased arousal, and negative moods and thoughts. Intrusive Symptoms This is when a person re-experiences the traumatic event through one or more of the following ways:  Distressing dreams.  Feelings of fear, horror, intense sadness, or anger in response to a reminder of the trauma.  Unwanted distressing memories while awake.  Physical reactions triggered by reminders of the trauma, such as increased heart rate, shortness of breath, sweating, and shaking.  Having flashbacks, or feelings like you are going through the event again.  Avoidance Symptoms This is when a person avoids thoughts, conversations, people, or activities that are reminders of the trauma. Symptoms may also include:  Decreased interest or participation in daily activities.  Loss of connection or avoidance of other people.  Increased Arousal Symptoms This is when a person is more sensitive or reacts more easily to their environment. Symptoms may include:  Being easily startled.  Careless or self-destructive behavior.  Irritability.  Feeling on edge.  Difficulty  concentrating.  Verbal or physical outbursts of anger toward other people or objects.  Difficulty sleeping.  Negative Moods or Thoughts These may include:  Belief that oneself or others are bad.  Regular feelings of fear, horror, anger, sadness, guilt, or shame.  Not being able to remember certain parts of the traumatic event.  Blaming themselves or others for the trauma.  Inability to experience positive emotions, such as happiness or love.  PTSD symptoms may start soon after a frightening event or months or years later. Symptoms last at least one month and tend to disrupt relationships, work, and daily activities. How is this diagnosed? PTSD is diagnosed through an assessment by a mental health professional. Chloe Hopkins will be asked questions about any traumatic events. You will also be asked about how these events have changed your thoughts, mood, behavior, and ability to function on a daily basis. You may also be asked if you use alcohol or drugs. How is this treated? Treatment for PTSD may include:  Medicines. Certain medicines can reduce some PTSD symptoms.  Counseling (cognitive behavioral therapy). Talk therapy with a mental health professional who is experienced in treating PTSD can help.  Eye movement desensitization and reprocessing therapy (EMDR). This type of therapy occurs with a specialized therapist.  Many people with PTSD benefit from a combination of these treatments. If you have other mental health problems, such as depression, alcohol abuse, or drug addiction, your treatment plan will include treatment for these other conditions. Follow these instructions at home: Lifestyle  Find a support group in your community. Often groups are available for [employer] veterans, trauma victims, and family members or caregivers.  Find ways to relax. This may include: ? Breathing exercises. ? Meditation. ?  Yoga. ? Listening to quiet music.  Exercise regularly. Try to do at least  30 minutes of physical activity most days of the week.  Try to get 7-9 hours of sleep each night. To help with sleep: ? Keep your bedroom cool and dark. ? Do not eat a heavy meal within one hour of bedtime. ? Do not drink alcohol or caffeinated drinks before bed. ? Avoid screen time, such as television, computers, tablets, or cell phones before bed.  Do not drink alcohol or take illegal drugs.  Look into volunteer opportunities. This can help you feel more connected to your community.  Take steps to help yourself feel safer at home, such as by installing a security system.  Contact a local organization to find out if you are eligible for a service dog.  Keep daily contact with at least one trusted friend or family member.  If your PTSD is affecting your marriage or family, seek help from a family therapist. General instructions  Take over-the-counter and prescription medicines only as told by your health care provider.  Keep all follow-up visits as told by your health care provider and counselor. This is important.  Make sure to let all of your health care providers know you have PTSD. This is especially important if you are having surgery or need to be admitted to the hospital. Contact a health care provider if:  Your symptoms do not get better or get worse. Get help right away if:  You have thoughts of wanting to harm yourself or others. This information is not intended to replace advice given to you by your health care provider. Make sure you discuss any questions you have with your health care provider. Document Released: 12/04/2000 Document Revised: 07/03/2015 Document Reviewed: 02/24/2015 Elsevier Interactive Patient Education  2018 Elsevier Inc.  
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
## 2016-10-21 ENCOUNTER — Other Ambulatory Visit: Payer: Self-pay | Admitting: Physician Assistant

## 2016-10-21 ENCOUNTER — Encounter: Payer: Self-pay | Admitting: Physician Assistant

## 2016-10-21 DIAGNOSIS — F319 Bipolar disorder, unspecified: Secondary | ICD-10-CM

## 2016-10-21 DIAGNOSIS — F4310 Post-traumatic stress disorder, unspecified: Secondary | ICD-10-CM

## 2016-10-21 DIAGNOSIS — G479 Sleep disorder, unspecified: Secondary | ICD-10-CM

## 2016-10-21 MED ORDER — ALPRAZOLAM 0.5 MG PO TABS: 0.5000 mg | ORAL_TABLET | Freq: Every evening | ORAL | 5 refills | Status: DC | PRN

## 2016-10-21 NOTE — Progress Notes (Signed)
Alprazolam refilled.  
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
## 2016-10-29 ENCOUNTER — Ambulatory Visit: Payer: Self-pay | Admitting: Psychiatry

## 2016-10-29 ENCOUNTER — Ambulatory Visit (INDEPENDENT_AMBULATORY_CARE_PROVIDER_SITE_OTHER): Payer: Self-pay | Admitting: Psychiatry

## 2016-10-29 ENCOUNTER — Encounter: Payer: Self-pay | Admitting: Psychiatry

## 2016-10-29 VITALS — BP 111/78 | HR 93 | Temp 99.2°F | Wt 142.4 lb

## 2016-10-29 DIAGNOSIS — F411 Generalized anxiety disorder: Secondary | ICD-10-CM

## 2016-10-29 DIAGNOSIS — F4310 Post-traumatic stress disorder, unspecified: Secondary | ICD-10-CM

## 2016-10-29 MED ORDER — SERTRALINE HCL 25 MG PO TABS: 25.0000 mg | ORAL_TABLET | Freq: Every day | ORAL | 2 refills | Status: DC

## 2016-10-29 MED ORDER — TRAZODONE HCL 50 MG PO TABS: 50.0000 mg | ORAL_TABLET | Freq: Every day | ORAL | 1 refills | Status: DC
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
## 2016-11-12 ENCOUNTER — Ambulatory Visit: Payer: Self-pay | Admitting: Psychiatry

## 2016-12-25 ENCOUNTER — Encounter: Payer: Self-pay | Admitting: Physician Assistant

## 2016-12-25 ENCOUNTER — Ambulatory Visit (INDEPENDENT_AMBULATORY_CARE_PROVIDER_SITE_OTHER): Payer: Self-pay | Admitting: Physician Assistant

## 2016-12-25 VITALS — BP 102/80 | HR 106 | Temp 98.2°F | Resp 16 | Wt 146.6 lb

## 2016-12-25 DIAGNOSIS — Z9889 Other specified postprocedural states: Secondary | ICD-10-CM

## 2016-12-25 DIAGNOSIS — Z2821 Immunization not carried out because of patient refusal: Secondary | ICD-10-CM

## 2016-12-25 DIAGNOSIS — Z8742 Personal history of other diseases of the female genital tract: Secondary | ICD-10-CM

## 2016-12-25 DIAGNOSIS — R110 Nausea: Secondary | ICD-10-CM

## 2016-12-25 DIAGNOSIS — F5101 Primary insomnia: Secondary | ICD-10-CM

## 2016-12-25 DIAGNOSIS — N926 Irregular menstruation, unspecified: Secondary | ICD-10-CM

## 2016-12-25 MED ORDER — TEMAZEPAM 30 MG PO CAPS: 30.0000 mg | ORAL_CAPSULE | Freq: Every evening | ORAL | 0 refills | Status: DC | PRN

## 2016-12-25 NOTE — Progress Notes (Signed)
Patient: Chloe Hopkins Female    DOB: 14-Jan-1996   21 y.o.   MRN: [id number] Visit Date: 12/25/2016  Today's Provider: Margaretann Loveless, PA-C   Chief Complaint  Patient presents with  . Nausea   Subjective:    HPI Patient is here with c/o feeling nausea in the AM over the last 3 weeks. She has used phenergan and zofran without relief. No vomiting, fevers, chills. Last menstrual cycle was 11/23/16. Patient is normally very regular so she is 3 days late currently.   She also complains of having worsening insomnia. She was placed on trazodone by Dr. Daleen Bo. She reports it has not worked thus she stopped on her own and started her old Rx of alprazolam back. She still reports it is ineffective as well and she found herself taking 2 at a time to help her sleep, and it still would not keep her asleep all night.     Allergies  Allergen Reactions  . Coconut Oil      Current Outpatient Prescriptions:  .  lamoTRIgine (LAMICTAL) 100 MG tablet, Take one tab PO daily x 1 week, then increase to 2 tabs PO daily, Disp: 60 tablet, Rfl: 5 .  promethazine (PHENERGAN) 25 MG tablet, Take 1 tablet (25 mg total) by mouth every 8 (eight) hours as needed for nausea or vomiting., Disp: 20 tablet, Rfl: 0 .  sertraline (ZOLOFT) 25 MG tablet, Take 1 tablet (25 mg total) by mouth daily., Disp: 30 tablet, Rfl: 2 .  traZODone (DESYREL) 50 MG tablet, Take 1 tablet (50 mg total) by mouth at bedtime., Disp: 30 tablet, Rfl: 1  Review of Systems  Constitutional: Positive for fatigue. Negative for fever.  HENT: Negative.   Respiratory: Negative for cough, chest tightness and shortness of breath.   Cardiovascular: Negative for chest pain, palpitations and leg swelling.  Gastrointestinal: Positive for nausea. Negative for abdominal distention, abdominal pain, constipation, diarrhea, rectal pain and vomiting.  Neurological: Negative for dizziness, light-headedness and headaches.  Psychiatric/Behavioral: Positive  for decreased concentration and sleep disturbance. Negative for dysphoric mood. The patient is nervous/anxious.     Social History  Substance Use Topics  . Smoking status: Current Every Day Smoker    Packs/day: 2.00    Types: Cigarettes  . Smokeless tobacco: Never Used  . Alcohol use No   Objective:   BP 102/80 (BP Location: Left Arm, Patient Position: Sitting, Cuff Size: Normal)   Pulse (!) 106   Temp 98.2 F (36.8 C) (Oral)   Resp 16   Wt 146 lb 9.6 oz (66.5 kg)   LMP 11/23/2016   BMI 27.70 kg/m    Physical Exam  Constitutional: She is oriented to person, place, and time. She appears well-developed and well-nourished. No distress.  Cardiovascular: Normal rate, regular rhythm and normal heart sounds.  Exam reveals no gallop and no friction rub.   No murmur heard. Pulmonary/Chest: Effort normal and breath sounds normal. No respiratory distress. She has no wheezes. She has no rales.  Abdominal: Soft. Normal appearance and bowel sounds are normal. She exhibits no distension and no mass. There is no hepatosplenomegaly. There is tenderness in the right lower quadrant. There is no rebound, no guarding and no CVA tenderness.  Neurological: She is alert and oriented to person, place, and time.  Skin: Skin is warm and dry. She is not diaphoretic.  Vitals reviewed.      Assessment & Plan:     1. Nausea Urine pregnancy  negative today. Advised patient possibility of false negative as she is only 2 days late for her menses. Advised to call if still no menstrual cycle next week and will check beta hcg, blood. Patient has zofran  at home. Advised to increase to zofran  to see if this controls nausea. Call if symptoms worsen. - POCT urine pregnancy  2. Late menses See above medical treatment plan.  3. Primary insomnia Worsening. Failed alprazolam and trazodone. Will give temazepam as below. She is to call if she has adverse reactions. I will see her back in 4 weeks to see if  effective.  - temazepam (RESTORIL) 30 MG capsule; Take 1 capsule (30 mg total) by mouth at bedtime as needed for sleep.  Dispense: 30 capsule; Refill: 0  4. H/O ovarian cystectomy Has had ovarian cyst on the left. Possible she may be having one on the right. Also possible she may be ovulating from the right. Will await to see is she starts her menstrual cycle. If no cycle by next week she is to call and will check beta hcg. If that still negative will get pelvic/transvaginal US to r/o another ovarian cyst.   5. Influenza vaccination declined      Margaretann Loveless, PA-C  [facility]

## 2016-12-25 NOTE — Patient Instructions (Signed)
Temazepam tablets or capsules What is this medicine? TEMAZEPAM (te MAZ e pam) is a benzodiazepine. It is used to help you to fall asleep and sleep through the night. This medicine may be used for other purposes; ask your health care provider or pharmacist if you have questions. COMMON BRAND NAME(S): Restoril What should I tell my health care provider before I take this medicine? They need to know if you have any of these conditions: -an alcohol or drug abuse problem -bipolar disorder, depression, psychosis or other mental health condition -kidney disease -liver disease -lung or breathing disease -myasthenia gravis -Parkinson's disease -porphyria -seizures or a history of seizures -suicidal thoughts -an unusual or allergic reaction to temazepam, other benzodiazepines, other medicines, foods, dyes, or preservatives -pregnant or trying to get pregnant -breast-feeding How should I use this medicine? Take this medicine by mouth. It is only for use at bedtime. Follow the directions on the prescription label. Swallow the tablets or capsules with a drink of water. If it upsets your stomach, take it with food or milk. Do not take your medicine more often than directed. Do not stop taking except on the advice of your doctor or health care professional. A special MedGuide will be given to you by the pharmacist with each prescription and refill. Be sure to read this information carefully each time. Talk to your pediatrician regarding the use of this medicine in children. Special care may be needed. Overdosage: If you think you have taken too much of this medicine contact a poison control center or emergency room at once. NOTE: This medicine is only for you. Do not share this medicine with others. What if I miss a dose? If you miss a dose, take it as soon as you can. If it is almost time for your next dose, take only that dose. Do not take double or extra doses. What may interact with this  medicine? Do not take this medicine with any of the following medications: -narcotic medicines for cough -sodium oxybate This medicine may also interact with the following medications: -alcohol -antihistamines for allergy, cough and cold -certain medicines for anxiety or sleep -certain medicines for depression, like amitriptyline, fluoxetine, sertraline -certain medicines for seizures like phenobarbital, primidone -general anesthetics like lidocaine, pramoxine, tetracaine -medicines that relax muscles for surgery -narcotic medicines for pain -phenothiazines like chlorpromazine, mesoridazine, prochlorperazine, thioridazine This list may not describe all possible interactions. Give your health care provider a list of all the medicines, herbs, non-prescription drugs, or dietary supplements you use. Also tell them if you smoke, drink alcohol, or use illegal drugs. Some items may interact with your medicine. What should I watch for while using this medicine? Tell your doctor or health care professional if your symptoms do not start to get better or if they get worse. Do not stop taking except on your doctor's advice. You may develop a severe reaction. Your doctor will tell you how much medicine to take. You may get drowsy or dizzy. Do not drive, use machinery, or do anything that needs mental alertness until you know how this medicine affects you. Do not stand or sit up quickly, especially if you are an older patient. This reduces the risk of dizzy or fainting spells. Alcohol may interfere with the effect of this medicine. Avoid alcoholic drinks. If you are taking another medicine that also causes drowsiness, you may have more side effects. Give your health care provider a list of all medicines you use. Your doctor will tell you how   much medicine to take. Do not take more medicine than directed. Call emergency for help if you have problems breathing or unusual sleepiness. After taking this medicine  for sleep, you may get up out of bed while not being fully awake and do an activity that you do not know you are doing. The next morning, you may have no memory of the event. Activities such as driving a car ("sleep-driving"), making and eating food, talking on the phone, sexual activity, and sleep-walking have been reported. Call your doctor right away if you find out you have done any of these activities. Do not take this medicine if you have used alcohol that evening or before bed or taken another medicine for sleep since your risk of doing these sleep-related activities will be increased. Do not take this medicine unless you are able to stay in bed for a full night (7 to 8 hour) before you must be active again. You may have a decrease in mental alertness the day after use, even if you feel that you are fully awake. Tell your doctor if you will need to perform activities requiring full alertness, such as driving, the next day. Do not stand or sit up quickly after taking this medicine, especially if you are an older patient. This reduces the risk of dizzy or fainting spells. If you or your family notice any changes in your behavior, such as new or worsening depression, thoughts of harming yourself, anxiety, other unusual or disturbing thoughts, or memory loss, call your doctor right away. After you stop taking this medicine, you may have trouble falling asleep. This is called rebound insomnia. This problem usually goes away on its own after 1 or 2 nights. Women should inform their doctor if they wish to become pregnant or think they might be pregnant. There is a potential for serious side effects to an unborn child. Talk to your health care professional or pharmacist for more information. What side effects may I notice from receiving this medicine? Side effects that you should report to your doctor or health care professional as soon as possible: -allergic reactions like skin rash, itching or hives,  swelling of the face, lips, or tongue -breathing problems -confusion -loss of balance or coordination -signs and symptoms of low blood pressure like dizziness; feeling faint or lightheaded, falls; unusually weak or tired -suicidal thoughts or other mood changes -unusual activities while asleep like driving, eating, making phone calls Side effects that usually do not require medical attention (report to your doctor or health care professional if they continue or are bothersome): -diarrhea -dizziness -nausea, vomiting -tiredness This list may not describe all possible side effects. Call your doctor for medical advice about side effects. You may report side effects to FDA at 1-800-FDA-1088. Where should I keep my medicine? Keep out of the reach of children. This medicine can be abused. Keep your medicine in a safe place to protect it from theft. Do not share this medicine with anyone. Selling or giving away this medicine is dangerous and against the law. This medicine may cause accidental overdose and death if taken by other adults, children, or pets. Mix any unused medicine with a substance like cat litter or coffee grounds. Then throw the medicine away in a sealed container like a sealed bag or a coffee can with a lid. Do not use the medicine after the expiration date. Store at room temperature below 30 degrees C (86 degrees F). Protect from light. Keep container tightly closed. NOTE:   This sheet is a summary. It may not cover all possible information. If you have questions about this medicine, talk to your doctor, pharmacist, or health care provider.  2018 Elsevier/Gold Standard (2014-12-08 23:44:07)  

## 2016-12-26 LAB — POCT URINE PREGNANCY: PREG TEST UR: NEGATIVE

## 2016-12-30 ENCOUNTER — Telehealth: Payer: Self-pay | Admitting: Physician Assistant

## 2016-12-30 DIAGNOSIS — N926 Irregular menstruation, unspecified: Secondary | ICD-10-CM

## 2016-12-30 NOTE — Telephone Encounter (Signed)
Please Review.  Thanks,  -Jhonny Calixto 

## 2016-12-30 NOTE — Telephone Encounter (Signed)
Patient advised as directed below.  Thanks,  -Cyana Shook 

## 2016-12-30 NOTE — Telephone Encounter (Signed)
Beta HCG ordered. Patient may come and have drawn at her convenience.

## 2016-12-30 NOTE — Telephone Encounter (Signed)
Pt states was seen last by [facility].  Pt states she is still not having a period and she is still waking up with nausea.  CB#[phone]/MW

## 2016-12-31 ENCOUNTER — Telehealth: Payer: Self-pay

## 2016-12-31 DIAGNOSIS — R110 Nausea: Secondary | ICD-10-CM

## 2016-12-31 DIAGNOSIS — R1031 Right lower quadrant pain: Secondary | ICD-10-CM

## 2016-12-31 DIAGNOSIS — Z8742 Personal history of other diseases of the female genital tract: Secondary | ICD-10-CM

## 2016-12-31 LAB — HCG, QUANTITATIVE, PREGNANCY

## 2016-12-31 NOTE — Telephone Encounter (Signed)
-----   Message from Margaretann Loveless, PA-C sent at 12/31/2016  8:31 AM EDT ----- HCG negative. Is she interested in pelvic US again?

## 2016-12-31 NOTE — Telephone Encounter (Signed)
na

## 2017-01-01 NOTE — Telephone Encounter (Signed)
Pt called to see if her lab results were back. Please advise. Thanks TNP 

## 2017-01-01 NOTE — Telephone Encounter (Signed)
US ordered

## 2017-01-01 NOTE — Telephone Encounter (Signed)
Patient advised. She is interested in a repeat pelvic US. She is aware that once the order is placed she will get a call back with appointment date/time.

## 2017-01-06 ENCOUNTER — Ambulatory Visit: Payer: BLUE CROSS/BLUE SHIELD

## 2017-01-07 ENCOUNTER — Ambulatory Visit
Admission: RE | Admit: 2017-01-07 | Discharge: 2017-01-07 | Disposition: A | Discharge status: Home / Self Care | Payer: Self-pay | Source: Ambulatory Visit | Attending: Physician Assistant | Admitting: Physician Assistant

## 2017-01-07 ENCOUNTER — Telehealth: Payer: Self-pay

## 2017-01-07 DIAGNOSIS — R110 Nausea: Secondary | ICD-10-CM | POA: Insufficient documentation

## 2017-01-07 DIAGNOSIS — R102 Pelvic and perineal pain: Secondary | ICD-10-CM

## 2017-01-07 DIAGNOSIS — Z8742 Personal history of other diseases of the female genital tract: Secondary | ICD-10-CM | POA: Insufficient documentation

## 2017-01-07 DIAGNOSIS — R1031 Right lower quadrant pain: Secondary | ICD-10-CM | POA: Insufficient documentation

## 2017-01-07 NOTE — Telephone Encounter (Signed)
-----   Message from Margaretann Loveless, [place] sent at 01/07/2017 12:00 PM EDT ----- Pelvic US fairly unremarkable but there were some very small cyst noted throughout the endometrium (interior lining of the uterus). Would she prefer a GYN referral for further evaluation.

## 2017-01-07 NOTE — Telephone Encounter (Signed)
Spoke with patient also, advised as per report it says a few small cystic areas. She wants to proceed with GYN referral. Do you have anyone specific you want her to see? She is aware she will get a call back once appointment is set [employer], RMA

## 2017-01-07 NOTE — Telephone Encounter (Signed)
Patient advised as directed below. Per patient she wants the referral to GYN. She also wanted the specific number on how many cyst and advised what the [place] reports said.  Thanks,  -Joseline

## 2017-01-08 NOTE — Telephone Encounter (Signed)
Referral placed.

## 2017-01-15 ENCOUNTER — Telehealth: Payer: Self-pay | Admitting: Obstetrics and Gynecology

## 2017-01-15 NOTE — Telephone Encounter (Signed)
[facility] refring for Pelvic pain. Called and left voicemail for patient to call back to be schedule

## 2017-01-16 ENCOUNTER — Emergency Department: Payer: Self-pay

## 2017-01-16 ENCOUNTER — Emergency Department
Admission: EM | Admit: 2017-01-16 | Discharge: 2017-01-16 | Disposition: A | Discharge status: Home / Self Care | Payer: Self-pay | Attending: Emergency Medicine | Admitting: Emergency Medicine

## 2017-01-16 ENCOUNTER — Encounter: Payer: Self-pay | Admitting: Emergency Medicine

## 2017-01-16 DIAGNOSIS — J45909 Unspecified asthma, uncomplicated: Secondary | ICD-10-CM | POA: Insufficient documentation

## 2017-01-16 DIAGNOSIS — F319 Bipolar disorder, unspecified: Secondary | ICD-10-CM | POA: Insufficient documentation

## 2017-01-16 DIAGNOSIS — Z9049 Acquired absence of other specified parts of digestive tract: Secondary | ICD-10-CM | POA: Insufficient documentation

## 2017-01-16 DIAGNOSIS — Z79899 Other long term (current) drug therapy: Secondary | ICD-10-CM | POA: Insufficient documentation

## 2017-01-16 DIAGNOSIS — M79671 Pain in right foot: Secondary | ICD-10-CM | POA: Insufficient documentation

## 2017-01-16 DIAGNOSIS — F419 Anxiety disorder, unspecified: Secondary | ICD-10-CM | POA: Insufficient documentation

## 2017-01-16 DIAGNOSIS — F909 Attention-deficit hyperactivity disorder, unspecified type: Secondary | ICD-10-CM | POA: Insufficient documentation

## 2017-01-16 DIAGNOSIS — F17210 Nicotine dependence, cigarettes, uncomplicated: Secondary | ICD-10-CM | POA: Insufficient documentation

## 2017-01-16 MED ORDER — ACETAMINOPHEN 500 MG PO TABS
1000.0000 mg | ORAL_TABLET | Freq: Once | ORAL | Status: AC
  Administered 2017-01-16: 1000 mg via ORAL
  Filled 2017-01-16: qty 2

## 2017-01-16 MED ORDER — IBUPROFEN 600 MG PO TABS: 600.0000 mg | ORAL_TABLET | Freq: Three times a day (TID) | ORAL | 0 refills | Status: DC | PRN

## 2017-01-16 NOTE — Discharge Instructions (Signed)
Follow up with your primary care provider if any continued problems. Ice and elevate for today. Use Ace wrap and wood shoe for added support and protection. Begin taking ibuprofen today with food.

## 2017-01-16 NOTE — ED Notes (Signed)
Patient does not appear to be in any acute distress at time of discharge. Patient ambulatory to lobby with steady gate. Patient denies any comments or concerns regarding discharge.  

## 2017-01-16 NOTE — ED Triage Notes (Signed)
Pt reports yesterday started with pain in her right foot. Denies injuries but states can not walk on it. Foot warm to touch, slight swelling, no obvious injuries noted.

## 2017-01-16 NOTE — Telephone Encounter (Signed)
Left voicemail for patient to call back to be schedule °

## 2017-01-16 NOTE — ED Provider Notes (Signed)
[facility] Emergency Department Provider Note   ____________________________________________   First MD Initiated Contact with Patient 01/16/17 [phone]     (approximate)  I have reviewed the triage vital signs and the nursing notes.   HISTORY  Chief Complaint Foot Pain    HPI Chloe Hopkins is a 21 y.o. female she is complaining of right foot pain that started yesterday. Patient states that she was unable to bear weight to walk secondary to her pain. Patient denies any history of injury. She is not taking any over-the-counter medications.currently she rates her pain as 7 out of 10.   Past Medical History:  Diagnosis Date  . ADHD (attention deficit hyperactivity disorder)   . Anxiety   . Asthma   . Bipolar disorder (HCC)   . Depression   . History of fainting spells of unknown cause   . Migraine headache   . Seizures (HCC)     There are no active problems to display for this patient.   Past Surgical History:  Procedure Laterality Date  . CHOLECYSTECTOMY  2012  . TONSILLECTOMY AND ADENOIDECTOMY      Prior to Admission medications   Medication Sig Start Date End Date Taking? Authorizing Provider  ibuprofen (ADVIL,MOTRIN) 600 MG tablet Take 1 tablet (600 mg total) by mouth every 8 (eight) hours as needed. 01/16/17   Tommi Rumps, PA-C  lamoTRIgine (LAMICTAL) 100 MG tablet Take one tab PO daily x 1 week, then increase to 2 tabs PO daily 05/31/16   Margaretann Loveless, PA-C  promethazine (PHENERGAN) 25 MG tablet Take 1 tablet (25 mg total) by mouth every 8 (eight) hours as needed for nausea or vomiting. 05/31/16   Margaretann Loveless, PA-C  sertraline (ZOLOFT) 25 MG tablet Take 1 tablet (25 mg total) by mouth daily. 10/29/16 10/29/17  Patrick North, MD  temazepam (RESTORIL) 30 MG capsule Take 1 capsule (30 mg total) by mouth at bedtime as needed for sleep. 12/25/16   Margaretann Loveless, PA-C    Allergies Coconut oil  Family History  Problem  Relation Age of Onset  . Diabetes Mother   . Bipolar disorder Father     Social History Social History  Substance Use Topics  . Smoking status: Current Every Day Smoker    Packs/day: 2.00    Types: Cigarettes  . Smokeless tobacco: Never Used  . Alcohol use No    Review of Systems Constitutional: No fever/chills Cardiovascular: Denies chest pain. Respiratory: Denies shortness of breath. Musculoskeletal: positive for right foot pain. Skin: Negative for rash. Neurological: Negative for focal weakness or numbness. ____________________________________________   PHYSICAL EXAM:  VITAL SIGNS: ED Triage Vitals  Enc Vitals Group     BP 01/16/17 0922 123/78     Pulse Rate 01/16/17 0922 (!) 101     Resp 01/16/17 0922 20     Temp 01/16/17 0922 98 F (36.7 C)     Temp Source 01/16/17 0922 Oral     SpO2 01/16/17 0922 96 %     Weight 01/16/17 0923 145 lb (65.8 kg)     Height 01/16/17 0923 5\' 1"  (1.549 m)     Head Circumference --      Peak Flow --      Pain Score --      Pain Loc --      Pain Edu? --      Excl. in GC? --    Constitutional: Alert and oriented. Well appearing and in no acute  distress. Eyes: Conjunctivae are normal.  Head: Atraumatic. Neck: No stridor.   Cardiovascular: Normal rate, regular rhythm. Grossly normal heart sounds.  Good peripheral circulation. Respiratory: Normal respiratory effort.  No retractions. Lungs CTAB. Musculoskeletal: examination of the right foot there is no gross deformity and no soft tissue swelling present. Patient is tender to palpation on her proximal fifth metatarsal. No erythema, ecchymosis or abrasions were noted. Motor sensory function intact. Capillary refill is less than 3 seconds. Neurologic:  Normal speech and language. No gross focal neurologic deficits are appreciated.  Skin:  Skin is warm, dry and intact.  Psychiatric: Mood and affect are normal. Speech and behavior are  normal.  ____________________________________________   LABS (all labs ordered are listed, but only abnormal results are displayed)  Labs Reviewed - No data to display  RADIOLOGY  Dg Foot Complete Right  Result Date: 01/16/2017 CLINICAL DATA:  Right foot pain.  No known injury. EXAM: RIGHT FOOT COMPLETE - 3+ VIEW COMPARISON:  None. FINDINGS: There is no evidence of fracture or dislocation. There is no evidence of arthropathy or other focal bone abnormality. Soft tissues are unremarkable. IMPRESSION: Negative. Electronically Signed   By: Charlett NoseKevin  Dover M.D.   On: 01/16/2017 10:56    ____________________________________________   PROCEDURES  Procedure(s) performed: None  Procedures  Critical Care performed: No  ____________________________________________   INITIAL IMPRESSION / ASSESSMENT AND PLAN / ED COURSE Patient was reassured that there was no bony abnormality after x-ray results. Patient was placed in an Ace wrap, postop shoe and given crutches. Patient was given Tylenol while in the ED since she had not eaten.  Patient was discharged with a prescription for ibuprofen. She is follow-up with her PCP if any continued problems.   ___________________________________________   FINAL CLINICAL IMPRESSION(S) / ED DIAGNOSES  Final diagnoses:  Foot pain, right      NEW MEDICATIONS STARTED DURING THIS VISIT:  New Prescriptions   IBUPROFEN (ADVIL,MOTRIN) 600 MG TABLET    Take 1 tablet (600 mg total) by mouth every 8 (eight) hours as needed.     Note:  This document was prepared using Dragon voice recognition software and may include unintentional dictation errors.    Tommi RumpsSummers, Haide Klinker L, PA-C 01/16/17 1134    Jeanmarie PlantMcShane, James A, MD 01/16/17 [phone]

## 2017-01-22 NOTE — Telephone Encounter (Signed)
Called and spoke with patient about schedule appt. Pt will call back to be schedule

## 2017-01-30 NOTE — Telephone Encounter (Signed)
Called and spoke with patient about schedule appt. Pt will call back to be schedule. Letter sent to provider

## 2017-02-19 ENCOUNTER — Telehealth: Payer: Self-pay | Admitting: Physician Assistant

## 2017-02-19 NOTE — Telephone Encounter (Signed)
FYI--Pt has not scheduled appointment to see gyn for pelvic pain & nausea.She states she is unable to afford at this time.She was given their contact information in case she would like to schedule in the future

## 2017-03-22 ENCOUNTER — Emergency Department
Admission: EM | Admit: 2017-03-22 | Discharge: 2017-03-22 | Disposition: A | Discharge status: Home / Self Care | Payer: Self-pay | Attending: Emergency Medicine | Admitting: Emergency Medicine

## 2017-03-22 ENCOUNTER — Other Ambulatory Visit: Payer: Self-pay

## 2017-03-22 DIAGNOSIS — R112 Nausea with vomiting, unspecified: Secondary | ICD-10-CM | POA: Insufficient documentation

## 2017-03-22 DIAGNOSIS — Z79899 Other long term (current) drug therapy: Secondary | ICD-10-CM | POA: Insufficient documentation

## 2017-03-22 DIAGNOSIS — F17210 Nicotine dependence, cigarettes, uncomplicated: Secondary | ICD-10-CM | POA: Insufficient documentation

## 2017-03-22 DIAGNOSIS — R51 Headache: Secondary | ICD-10-CM | POA: Insufficient documentation

## 2017-03-22 DIAGNOSIS — F909 Attention-deficit hyperactivity disorder, unspecified type: Secondary | ICD-10-CM | POA: Insufficient documentation

## 2017-03-22 DIAGNOSIS — R197 Diarrhea, unspecified: Secondary | ICD-10-CM | POA: Insufficient documentation

## 2017-03-22 DIAGNOSIS — J45909 Unspecified asthma, uncomplicated: Secondary | ICD-10-CM | POA: Insufficient documentation

## 2017-03-22 LAB — COMPREHENSIVE METABOLIC PANEL
ALBUMIN: 4.4 g/dL (ref 3.5–5.0)
ALT: 15 U/L (ref 14–54)
ANION GAP: 7 (ref 5–15)
AST: 23 U/L (ref 15–41)
Alkaline Phosphatase: 66 U/L (ref 38–126)
BUN: 15 mg/dL (ref 6–20)
CHLORIDE: 106 mmol/L (ref 101–111)
CO2: 24 mmol/L (ref 22–32)
Calcium: 9 mg/dL (ref 8.9–10.3)
Creatinine, Ser: 0.64 mg/dL (ref 0.44–1.00)
GFR calc Af Amer: >60 mL/min (ref >60)
GFR calc non Af Amer: >60 mL/min (ref >60)
GLUCOSE: 91 mg/dL (ref 65–99)
POTASSIUM: 4 mmol/L (ref 3.5–5.1)
SODIUM: 137 mmol/L (ref 135–145)
Total Bilirubin: 0.7 mg/dL (ref 0.3–1.2)
Total Protein: 7.6 g/dL (ref 6.5–8.1)

## 2017-03-22 LAB — CBC WITH DIFFERENTIAL/PLATELET
BASOS ABS: 0 10*3/uL (ref 0–0.1)
BASOS PCT: 0 %
EOS ABS: 0 10*3/uL (ref 0–0.7)
EOS PCT: 0 %
HCT: 47.1 % — ABNORMAL HIGH (ref 35.0–47.0)
Hemoglobin: 16 g/dL (ref 12.0–16.0)
Lymphocytes Relative: 17 %
Lymphs Abs: 2.3 10*3/uL (ref 1.0–3.6)
MCH: 30 pg (ref 26.0–34.0)
MCHC: 34 g/dL (ref 32.0–36.0)
MCV: 88.3 fL (ref 80.0–100.0)
MONO ABS: 0.7 10*3/uL (ref 0.2–0.9)
Monocytes Relative: 5 %
NEUTROS ABS: 10.9 10*3/uL — AB (ref 1.4–6.5)
Neutrophils Relative %: 78 %
PLATELETS: 205 10*3/uL (ref 150–440)
RBC: 5.33 MIL/uL — ABNORMAL HIGH (ref 3.80–5.20)
RDW: 13.4 % (ref 11.5–14.5)
WBC: 13.9 10*3/uL — ABNORMAL HIGH (ref 3.6–11.0)

## 2017-03-22 MED ORDER — SODIUM CHLORIDE 0.9 % IV BOLUS (SEPSIS): 1000.0000 mL | Freq: Once | INTRAVENOUS | Status: DC

## 2017-03-22 MED ORDER — ONDANSETRON HCL 4 MG PO TABS: 4.0000 mg | ORAL_TABLET | Freq: Three times a day (TID) | ORAL | 0 refills | Status: DC | PRN

## 2017-03-22 NOTE — Discharge Instructions (Signed)
Return to the emergency room for any new or worrisome symptoms.  Follow closely with primary care.  You  refused to the care here or pregnancy test which is your responsibility on right however it does limit our  ability to work you up; return to the emergency room if you feel worse in any way.

## 2017-03-22 NOTE — ED Provider Notes (Addendum)
Daphane Shepherd/jmh Lynden Oxford/j,h [facility] Emergency Department Provider Note  ____________________________________________   I have reviewed the triage vital signs and the nursing notes. Where available I have reviewed prior notes and, if possible and indicated, outside hospital notes.    HISTORY  Chief Complaint Emesis and Headache    HPI Chloe Hopkins is a 21 y.o. female who is healthy, well known to this facility for multiple prior visits this year, presents today complaining of diarrhea, and vomiting for the last day or 2.  Her mother had the exact same symptoms.  She has a slight headache which she forgot to mention to me until I specifically asked.  She is watching television and states that she needs a work note and she would like some IV fluid because she still feels dehydrated.  She has not vomited since this morning.  She is been tolerating p.o. since that time.  Denies any ongoing abdominal pain.  She does not believe she is pregnant she is currently on her menstrual period, she denies dysuria or urinary frequency.   Location: She denies abdominal pain Radiation: Denies Quality: She denies Duration: 2 days Timing: 2 days Severity: Feeling better Associated sxs: Vomiting PriorTreatment : None   Past Medical History:  Diagnosis Date  . ADHD (attention deficit hyperactivity disorder)   . Anxiety   . Asthma   . Bipolar disorder (HCC)   . Depression   . History of fainting spells of unknown cause   . Migraine headache   . Seizures (HCC)     There are no active problems to display for this patient.   Past Surgical History:  Procedure Laterality Date  . CHOLECYSTECTOMY  2012  . TONSILLECTOMY AND ADENOIDECTOMY      Prior to Admission medications   Medication Sig Start Date End Date Taking? Authorizing Provider  ibuprofen (ADVIL,MOTRIN) 600 MG tablet Take 1 tablet (600 mg total) by mouth every 8 (eight) hours as needed. 01/16/17   Tommi RumpsSummers, Rhonda L, PA-C   lamoTRIgine (LAMICTAL) 100 MG tablet Take one tab PO daily x 1 week, then increase to 2 tabs PO daily 05/31/16   Margaretann LovelessBurnette, Jennifer M, PA-C  promethazine (PHENERGAN) 25 MG tablet Take 1 tablet (25 mg total) by mouth every 8 (eight) hours as needed for nausea or vomiting. 05/31/16   Margaretann LovelessBurnette, Jennifer M, PA-C  sertraline (ZOLOFT) 25 MG tablet Take 1 tablet (25 mg total) by mouth daily. 10/29/16 10/29/17  Patrick Northavi, Himabindu, MD  temazepam (RESTORIL) 30 MG capsule Take 1 capsule (30 mg total) by mouth at bedtime as needed for sleep. 12/25/16   Margaretann LovelessBurnette, Jennifer M, PA-C    Allergies Coconut oil  Family History  Problem Relation Age of Onset  . Diabetes Mother   . Bipolar disorder Father     Social History Social History   Tobacco Use  . Smoking status: Current Every Day Smoker    Packs/day: 2.00    Types: Cigarettes  . Smokeless tobacco: Never Used  Substance Use Topics  . Alcohol use: No  . Drug use: No    Review of Systems Constitutional: No fever/chills Eyes: No visual changes. ENT: No sore throat. No stiff neck no neck pain Cardiovascular: Denies chest pain. Respiratory: Denies shortness of breath. Gastrointestinal: Positive vomiting, and nonbloody nonbilious diarrhea Genitourinary: Negative for dysuria. Musculoskeletal: Negative lower extremity swelling Skin: Negative for rash. Neurological: Negative for severe headaches, focal weakness or numbness.   ____________________________________________   PHYSICAL EXAM:  VITAL SIGNS: ED Triage Vitals [03/22/17  1155]  Enc Vitals Group     BP (!) 120/92     Pulse Rate 91     Resp 18     Temp (!) 97.5 F (36.4 C)     Temp Source Oral     SpO2 97 %     Weight 145 lb (65.8 kg)     Height 5\' 1"  (1.549 m)     Head Circumference      Peak Flow      Pain Score 5     Pain Loc      Pain Edu?      Excl. in GC?     Constitutional: Alert and oriented. Well appearing and in no acute distress. Eyes: Conjunctivae are normal Head:  Atraumatic HEENT: No congestion/rhinnorhea. Mucous membranes are moist.  Oropharynx non-erythematous Neck:   Nontender with no meningismus, no masses, no stridor Cardiovascular: Normal rate, regular rhythm. Grossly normal heart sounds.  Good peripheral circulation. Respiratory: Normal respiratory effort.  No retractions. Lungs CTAB. Abdominal: Soft and nontender. No distention. No guarding no rebound Back:  There is no focal tenderness or step off.  there is no midline tenderness there are no lesions noted. there is no CVA tenderness Musculoskeletal: No lower extremity tenderness, no upper extremity tenderness. No joint effusions, no DVT signs strong distal pulses no edema Neurologic:  Normal speech and language. No gross focal neurologic deficits are appreciated.  Skin:  Skin is warm, dry and intact. No rash noted. Psychiatric: Mood and affect are normal. Speech and behavior are normal.  ____________________________________________   LABS (all labs ordered are listed, but only abnormal results are displayed)  Labs Reviewed  CBC WITH DIFFERENTIAL/PLATELET - Abnormal; Notable for the following components:      Result Value   WBC 13.9 (*)    RBC 5.33 (*)    HCT 47.1 (*)    Neutro Abs 10.9 (*)    All other components within normal limits  COMPREHENSIVE METABOLIC PANEL  URINALYSIS, COMPLETE (UACMP) WITH MICROSCOPIC  POC OCCULT BLOOD, ED    Pertinent labs  results that were available during my care of the patient were reviewed by me and considered in my medical decision making (see chart for details). ____________________________________________  EKG  I personally interpreted any EKGs ordered by me or triage  ____________________________________________  RADIOLOGY  Pertinent labs & imaging results that were available during my care of the patient were reviewed by me and considered in my medical decision making (see chart for details). If possible, patient and/or family made aware  of any abnormal findings.  No results found. ____________________________________________    PROCEDURES  Procedure(s) performed: None  Procedures  Critical Care performed: None  ____________________________________________   INITIAL IMPRESSION / ASSESSMENT AND PLAN / ED COURSE  Pertinent labs & imaging results that were available during my care of the patient were reviewed by me and considered in my medical decision making (see chart for details).  Well-appearing woman here with vomiting and diarrhea nonbloody nonbilious here for a work note and some IV fluid as she still feels dehydrated we will give IV fluid, she is not actively vomiting and states her nausea is getting better we will check urine pregnancy and if that is all reassuring we will try to get her home.  No evidence of meningitis or acute intra-abdominal pathology today  ----------------------------------------- 4:47 PM on 03/22/2017 -----------------------------------------  Patient declines offered urinalysis urine pregnancy IV fluid or any further intervention, she states she has been tolerating  p.o. with no difficulty does not want to eat or drink she just really wants a work note and would like to go home.  She stresses the need for a work note.  Patient would like a work note.  Return precautions are given and understood.    ____________________________________________   FINAL CLINICAL IMPRESSION(S) / ED DIAGNOSES  Final diagnoses:  Nausea vomiting and diarrhea      This chart was dictated using voice recognition software.  Despite best efforts to proofread,  errors can occur which can change meaning.      Jeanmarie Plant, MD 03/22/17 1636    Jeanmarie Plant, MD 03/22/17 [phone]

## 2017-03-22 NOTE — ED Triage Notes (Signed)
Pt arrives to ED via POV with c/o vomiting and headache x2 days. Pt states her mother recently d/x'd with the flu. Pt denies any other s/x's or c/o's. Pt reports drinking plenty of fluids "but still feels dehydrated". Pt is A&O, in NAD; RR even, regular, and unlabored. Pt states she was sent home from work yesterday and needs a work note for her employer.

## 2017-03-22 NOTE — ED Notes (Addendum)
Pt came out of room to say she just wants to leave - refusing iv, fluids and giving a urine sample. Also refused po challenge. States she just needs a work note. Advised pt next time she just needs work note she can just let us know or go to urgent care so she doesn't get a big er bill. She said she didn't realize and will let us know that next time.

## 2017-04-18 ENCOUNTER — Other Ambulatory Visit: Payer: Self-pay

## 2017-04-18 ENCOUNTER — Encounter: Payer: Self-pay | Admitting: Emergency Medicine

## 2017-04-18 ENCOUNTER — Emergency Department: Payer: Self-pay

## 2017-04-18 ENCOUNTER — Emergency Department
Admission: EM | Admit: 2017-04-18 | Discharge: 2017-04-18 | Disposition: A | Discharge status: Home / Self Care | Payer: Self-pay | Attending: Emergency Medicine | Admitting: Emergency Medicine

## 2017-04-18 DIAGNOSIS — M79671 Pain in right foot: Secondary | ICD-10-CM | POA: Insufficient documentation

## 2017-04-18 DIAGNOSIS — F17210 Nicotine dependence, cigarettes, uncomplicated: Secondary | ICD-10-CM | POA: Insufficient documentation

## 2017-04-18 DIAGNOSIS — J45909 Unspecified asthma, uncomplicated: Secondary | ICD-10-CM | POA: Insufficient documentation

## 2017-04-18 MED ORDER — IBUPROFEN 800 MG PO TABS: 800.0000 mg | ORAL_TABLET | Freq: Three times a day (TID) | ORAL | 0 refills | Status: DC | PRN

## 2017-04-18 MED ORDER — IBUPROFEN 800 MG PO TABS
800.0000 mg | ORAL_TABLET | Freq: Once | ORAL | Status: AC
  Administered 2017-04-18: 800 mg via ORAL
  Filled 2017-04-18: qty 1

## 2017-04-18 NOTE — ED Provider Notes (Signed)
[facility] Emergency Department Provider Note  ____________________________________________   First MD Initiated Contact with Patient 04/18/17 1228     (approximate)  I have reviewed the triage vital signs and the nursing notes.   HISTORY  Chief Complaint Foot Pain    HPI Chloe Hopkins is a 22 y.o. female complains of right foot pain.  States it is numb and hurts to bear weight.  She describes the pain as 10 out of 10.  She denies any injury.  She was seen for the similar can complaints over a month ago.  She still has the shoe and Ace wrap.  She denies any calf pain.  Patient's past medical history as noted below  Past Medical History:  Diagnosis Date  . ADHD (attention deficit hyperactivity disorder)   . Anxiety   . Asthma   . Bipolar disorder (HCC)   . Depression   . History of fainting spells of unknown cause   . Migraine headache   . Seizures (HCC)     There are no active problems to display for this patient.   Past Surgical History:  Procedure Laterality Date  . CHOLECYSTECTOMY  2012  . TONSILLECTOMY AND ADENOIDECTOMY      Prior to Admission medications   Medication Sig Start Date End Date Taking? Authorizing Provider  ibuprofen (ADVIL,MOTRIN) 800 MG tablet Take 1 tablet (800 mg total) by mouth every 8 (eight) hours as needed. 04/18/17   Sherrie MustacheFisher, Roselyn BeringSusan W, PA-C    Allergies Coconut oil  Family History  Problem Relation Age of Onset  . Diabetes Mother   . Bipolar disorder Father     Social History Social History   Tobacco Use  . Smoking status: Current Every Day Smoker    Packs/day: 2.00    Types: Cigarettes  . Smokeless tobacco: Never Used  Substance Use Topics  . Alcohol use: No  . Drug use: No    Review of Systems  Constitutional: No fever/chills Eyes: No visual changes. ENT: No sore throat. Respiratory: Denies cough Genitourinary: Negative for dysuria. Musculoskeletal: Negative for back pain.  Positive for  right foot pain Skin: Negative for rash.    ____________________________________________   PHYSICAL EXAM:  VITAL SIGNS: ED Triage Vitals  Enc Vitals Group     BP 04/18/17 1150 118/85     Pulse Rate 04/18/17 1150 (!) 115     Resp 04/18/17 1150 18     Temp 04/18/17 1150 97.9 F (36.6 C)     Temp Source 04/18/17 1150 Oral     SpO2 04/18/17 1150 98 %     Weight 04/18/17 1150 140 lb (63.5 kg)     Height 04/18/17 1150 5\' 1"  (1.549 m)     Head Circumference --      Peak Flow --      Pain Score 04/18/17 1154 10     Pain Loc --      Pain Edu? --      Excl. in GC? --     Constitutional: Alert and oriented. Well appearing and in no acute distress. Eyes: Conjunctivae are normal.  Head: Atraumatic. Nose: No congestion/rhinnorhea. Mouth/Throat: Mucous membranes are moist.   Cardiovascular: Normal rate, regular rhythm. Respiratory: Normal respiratory effort.  No retractions GU: deferred Musculoskeletal: FROM all extremities, warm and well perfused, right foot is tender on the fifth metatarsal.  Patient is a little over reactive on the foot is palpated.  Neurovascular is intact.  Patient is able to bear weight  Neurologic:  Normal speech and language.  Skin:  Skin is warm, dry and intact. No rash noted. Psychiatric: Mood and affect are normal. Speech and behavior are normal.  ____________________________________________   LABS (all labs ordered are listed, but only abnormal results are displayed)  Labs Reviewed - No data to display ____________________________________________   ____________________________________________  RADIOLOGY  X-ray of the right foot is negative for fracture  ____________________________________________   PROCEDURES  Procedure(s) performed: No  Procedures    ____________________________________________   INITIAL IMPRESSION / ASSESSMENT AND PLAN / ED COURSE  Pertinent labs & imaging results that were available during my care of the  patient were reviewed by me and considered in my medical decision making (see chart for details).  Patient is a 22 year old female complaining of right foot pain.  She has been here previously for the same complaint.  She denies any trauma to the foot.  She states the pain is a numbness type pain.  On physical exam the right foot is tender along the fifth metatarsal.  Neurovascular is intact.  X-ray of the right foot is negative  Discussed the x-ray results with the patient.  She would like a work note for today.  She was instructed to wear the Ace wrap she currently has and the wooden shoe.  She is to follow-up with podiatry if not improving in the next week.  She is given a prescription for ibuprofen 800 mg 3 times daily.  Patient states she understands and will comply with our instructions.  She was discharged in stable condition.  As patient is exiting the ED she is laughing and jumping and playing.     As part of my medical decision making, I reviewed the following data within the electronic MEDICAL RECORD NUMBER Nursing notes reviewed and incorporated, Old chart reviewed, Radiograph reviewed , Notes from prior ED visits and [place] Controlled Substance Database  ____________________________________________   FINAL CLINICAL IMPRESSION(S) / ED DIAGNOSES  Final diagnoses:  Foot pain, right      NEW MEDICATIONS STARTED DURING THIS VISIT:  New Prescriptions   IBUPROFEN (ADVIL,MOTRIN) 800 MG TABLET    Take 1 tablet (800 mg total) by mouth every 8 (eight) hours as needed.     Note:  This document was prepared using Dragon voice recognition software and may include unintentional dictation errors.    Faythe Ghee, PA-C 04/18/17 1333    Sharyn Creamer, MD 04/18/17 [phone]

## 2017-04-18 NOTE — Discharge Instructions (Signed)
Follow-up with the podiatrist if your foot continues to hurt in 1 week.  Use ibuprofen for pain as needed.  Elevate and ice as needed.

## 2017-04-18 NOTE — ED Triage Notes (Addendum)
First Nurse Note:  C/O right foot numbness.  Onset of symptoms this morning.    Patient AAOx3.  Skin warm and dry. NAD

## 2017-04-18 NOTE — ED Notes (Signed)
See triage note  Presents with pain and numbness to right foot this am  Denies any injury  No swelling  States she noticed pain this am when she got up this am

## 2017-04-18 NOTE — ED Triage Notes (Signed)
Says foot numbness since she woke this am.  Says the pain is a 10.  Did have an injury to foot couple months ago, but it has been okay.

## 2017-04-19 ENCOUNTER — Emergency Department
Admission: EM | Admit: 2017-04-19 | Discharge: 2017-04-19 | Disposition: A | Discharge status: Home / Self Care | Payer: Self-pay | Attending: Emergency Medicine | Admitting: Emergency Medicine

## 2017-04-19 ENCOUNTER — Other Ambulatory Visit: Payer: Self-pay

## 2017-04-19 ENCOUNTER — Encounter: Payer: Self-pay | Admitting: Emergency Medicine

## 2017-04-19 DIAGNOSIS — J45909 Unspecified asthma, uncomplicated: Secondary | ICD-10-CM | POA: Insufficient documentation

## 2017-04-19 DIAGNOSIS — M722 Plantar fascial fibromatosis: Secondary | ICD-10-CM | POA: Insufficient documentation

## 2017-04-19 DIAGNOSIS — F909 Attention-deficit hyperactivity disorder, unspecified type: Secondary | ICD-10-CM | POA: Insufficient documentation

## 2017-04-19 DIAGNOSIS — F319 Bipolar disorder, unspecified: Secondary | ICD-10-CM | POA: Insufficient documentation

## 2017-04-19 DIAGNOSIS — F419 Anxiety disorder, unspecified: Secondary | ICD-10-CM | POA: Insufficient documentation

## 2017-04-19 DIAGNOSIS — Z9049 Acquired absence of other specified parts of digestive tract: Secondary | ICD-10-CM | POA: Insufficient documentation

## 2017-04-19 DIAGNOSIS — F17210 Nicotine dependence, cigarettes, uncomplicated: Secondary | ICD-10-CM | POA: Insufficient documentation

## 2017-04-19 MED ORDER — KETOROLAC TROMETHAMINE 30 MG/ML IJ SOLN
30.0000 mg | Freq: Once | INTRAMUSCULAR | Status: AC
  Administered 2017-04-19: 30 mg via INTRAMUSCULAR
  Filled 2017-04-19: qty 1

## 2017-04-19 NOTE — ED Triage Notes (Signed)
Pt c/o pain to right foot; seen here yesterday, xray negative; pt says she's to followup with ortho but has not; pt says she was prescribed Ibuprofen but "that's like candy to me"; pt also asking for a work note for a few days as she stands all day and she cries at work due to pain;

## 2017-04-19 NOTE — Discharge Instructions (Signed)
Please continue with ibuprofen.  You may use Tylenol for additional pain relief.  Massage plantar aspect of foot with frozen bottle of water by rolling the foot top of the water bottle.  He may also work on stretching the plantar fascia by breaking her toe to your nose.  Please follow-up with podiatrist next week.

## 2017-04-19 NOTE — ED Provider Notes (Signed)
[facility] EMERGENCY DEPARTMENT Provider Note   CSN: [id number] Arrival date & time: 04/19/17  2050     History   Chief Complaint Chief Complaint  Patient presents with  . Foot Pain    HPI Chloe Hopkins is a 22 y.o. female presents to the emergency department for evaluation of right foot pain.  Patient states she has had right foot pain since yesterday.  Symptoms were present upon awakening.  No fevers, warmth, redness or swelling.  She denies any trauma or injury.  She describes pain along the lateral aspect of the calcaneus along the plantar aspect of the calcaneus into the mid plantar aspect of the foot.  Pain is most severe pole of the first step in the morning upon awakening.  She works on her feet and [employer] and has moderate pain with standing and walking throughout the day.  She has been taking ibuprofen with only mild improvement.  HPI  Past Medical History:  Diagnosis Date  . ADHD (attention deficit hyperactivity disorder)   . Anxiety   . Asthma   . Bipolar disorder (HCC)   . Depression   . History of fainting spells of unknown cause   . Migraine headache   . Seizures (HCC)     There are no active problems to display for this patient.   Past Surgical History:  Procedure Laterality Date  . CHOLECYSTECTOMY  2012  . TONSILLECTOMY AND ADENOIDECTOMY      OB History    No data available       Home Medications    Prior to Admission medications   Medication Sig Start Date End Date Taking? Authorizing Provider  ibuprofen (ADVIL,MOTRIN) 800 MG tablet Take 1 tablet (800 mg total) by mouth every 8 (eight) hours as needed. 04/18/17   Faythe Ghee, PA-C    Family History Family History  Problem Relation Age of Onset  . Diabetes Mother   . Bipolar disorder Father     Social History Social History   Tobacco Use  . Smoking status: Current Every Day Smoker    Packs/day: 1.00    Types: Cigarettes  . Smokeless tobacco:  Never Used  Substance Use Topics  . Alcohol use: Yes    Comment: "barely"  . Drug use: No     Allergies   Coconut oil   Review of Systems Review of Systems  Constitutional: Negative for fever.  Respiratory: Negative for shortness of breath.   Cardiovascular: Negative for chest pain.  Musculoskeletal: Positive for arthralgias and gait problem. Negative for back pain and myalgias.  Skin: Negative for rash and wound.     Physical Exam Updated Vital Signs BP 135/83 (BP Location: Right Arm)   Pulse (!) 102   Temp 97.7 F (36.5 C) (Oral)   Resp 17   Ht 5\' 1"  (1.549 m)   Wt 63.5 kg (140 lb)   LMP 03/22/2017 (Approximate)   SpO2 98%   BMI 26.45 kg/m   Physical Exam  Constitutional: She is oriented to person, place, and time. She appears well-developed and well-nourished.  HENT:  Head: Normocephalic and atraumatic.  Cardiovascular: Normal rate and regular rhythm.  Pulmonary/Chest: Effort normal. No respiratory distress.  Musculoskeletal: Normal range of motion. She exhibits no edema or deformity.  Examination of the right lower extremity shows no swelling warmth erythema or edema.  She has a negative Homans sign.  2+ dorsalis pedis pulses.  2+ cap refill throughout all 5 digits of the  right foot.  There is no evidence of skin breakdown, swelling or fluctuance.  Patient has tenderness along the plantar aspect of the calcaneus as well as the lateral border of the calcaneus.  She has severe pain with resisted dorsiflexion along the plantar fascia.  She is able to actively plantarflex and dorsiflex.  Achilles tendon is intact.  Neurological: She is alert and oriented to person, place, and time.  Skin: Skin is warm. No rash noted. No erythema.  Psychiatric: She has a normal mood and affect. Her behavior is normal. Judgment and thought content normal.  Nursing note and vitals reviewed.    ED Treatments / Results  Labs (all labs ordered are listed, but only abnormal results are  displayed) Labs Reviewed - No data to display  EKG  EKG Interpretation None       Radiology Dg Foot Complete Right  Result Date: 04/18/2017 CLINICAL DATA:  Lateral foot pain, no known injury, initial encounter EXAM: RIGHT FOOT COMPLETE - 3+ VIEW COMPARISON:  01/16/2017 FINDINGS: There is no evidence of fracture or dislocation. There is no evidence of arthropathy or other focal bone abnormality. Soft tissues are unremarkable. IMPRESSION: No acute abnormality noted. Electronically Signed   By: Alcide CleverMark  Lukens M.D.   On: 04/18/2017 13:14   X-rays of the right foot from yesterday are reviewed by me today in the emergency department.  Impression: No evidence of acute bony abnormality.  No significant spurring along the calcaneus.   Procedures Procedures (including critical care time)  Medications Ordered in ED Medications  ketorolac (TORADOL) 30 MG/ML injection 30 mg (not administered)     Initial Impression / Assessment and Plan / ED Course  I have reviewed the triage vital signs and the nursing notes.  Pertinent labs & imaging results that were available during my care of the patient were reviewed by me and considered in my medical decision making (see chart for details).     22 year old female with right foot pain consistent with plantar fasciitis.  She is educated on massaging with ice, stretching the plantar fascia is taking anti-inflammatory medications.  She will follow-up with podiatry next week.  She is educated on signs and symptoms return to clinic for.  Final Clinical Impressions(s) / ED Diagnoses   Final diagnoses:  Plantar fasciitis    ED Discharge Orders    None       Ronnette JuniperGaines, Severa Jeremiah C, PA-C 04/19/17 2149    Jene EveryKinner, Robert, MD 04/19/17 2309

## 2017-08-06 ENCOUNTER — Other Ambulatory Visit: Payer: Self-pay

## 2017-08-06 ENCOUNTER — Encounter: Payer: Self-pay | Admitting: Emergency Medicine

## 2017-08-06 ENCOUNTER — Emergency Department: Payer: Self-pay

## 2017-08-06 ENCOUNTER — Emergency Department
Admission: EM | Admit: 2017-08-06 | Discharge: 2017-08-06 | Disposition: A | Discharge status: Home / Self Care | Payer: Self-pay | Attending: Emergency Medicine | Admitting: Emergency Medicine

## 2017-08-06 DIAGNOSIS — J45909 Unspecified asthma, uncomplicated: Secondary | ICD-10-CM | POA: Insufficient documentation

## 2017-08-06 DIAGNOSIS — M25551 Pain in right hip: Secondary | ICD-10-CM | POA: Insufficient documentation

## 2017-08-06 DIAGNOSIS — F17210 Nicotine dependence, cigarettes, uncomplicated: Secondary | ICD-10-CM | POA: Insufficient documentation

## 2017-08-06 LAB — POCT PREGNANCY, URINE: Preg Test, Ur: NEGATIVE

## 2017-08-06 MED ORDER — IBUPROFEN 800 MG PO TABS: 800.0000 mg | ORAL_TABLET | Freq: Three times a day (TID) | ORAL | 0 refills | Status: DC | PRN

## 2017-08-06 MED ORDER — DIAZEPAM 5 MG PO TABS: 5.0000 mg | ORAL_TABLET | Freq: Three times a day (TID) | ORAL | 0 refills | Status: DC | PRN

## 2017-08-06 NOTE — ED Notes (Signed)
Pt to STAT desk via w/c with no distress noted to inquire over wait time; pt updated on such and vs retaken; pt upset over wait time and comments that she is calling [facility] to see if the wait is shorter

## 2017-08-06 NOTE — ED Provider Notes (Signed)
[facility] Emergency Department Provider Note       Time seen: ----------------------------------------- 7:23 AM on 08/06/2017 -----------------------------------------   I have reviewed the triage vital signs and the nursing notes.  HISTORY   Chief Complaint Back Pain    HPI Chloe Hopkins is a 22 y.o. female with a history of ADHD, anxiety, asthma, bipolar disorder, depression, seizures who presents to the ED for right hip pain that began after getting into bed last night.  She denies a history of same, nothing makes it better.  Initially she was not able to walk but now she is able to ambulate on the right hip.  She is never had this pain before.  Past Medical History:  Diagnosis Date  . ADHD (attention deficit hyperactivity disorder)   . Anxiety   . Asthma   . Bipolar disorder (HCC)   . Depression   . History of fainting spells of unknown cause   . Migraine headache   . Seizures (HCC)     There are no active problems to display for this patient.   Past Surgical History:  Procedure Laterality Date  . CHOLECYSTECTOMY  2012  . TONSILLECTOMY AND ADENOIDECTOMY      Allergies Coconut oil  Social History Social History   Tobacco Use  . Smoking status: Current Every Day Smoker    Packs/day: 1.00    Types: Cigarettes  . Smokeless tobacco: Never Used  Substance Use Topics  . Alcohol use: Yes    Comment: "barely"  . Drug use: No   Review of Systems Constitutional: Negative for fever. Cardiovascular: Negative for chest pain. Respiratory: Negative for shortness of breath. Gastrointestinal: Negative for abdominal pain, vomiting and diarrhea. Musculoskeletal: Positive for right hip pain Skin: Negative for rash. Neurological: Negative for headaches, focal weakness or numbness.  All systems negative/normal/unremarkable except as stated in the HPI  ____________________________________________   PHYSICAL EXAM:  VITAL SIGNS: ED  Triage Vitals  Enc Vitals Group     BP 08/06/17 0159 (!) 125/95     Pulse Rate 08/06/17 0159 84     Resp 08/06/17 0159 20     Temp 08/06/17 0159 98.4 F (36.9 C)     Temp Source 08/06/17 0159 Oral     SpO2 08/06/17 0159 99 %     Weight 08/06/17 0200 157 lb (71.2 kg)     Height 08/06/17 0200  (1.549 m)     Head Circumference --      Peak Flow --      Pain Score 08/06/17 0200 8     Pain Loc --      Pain Edu? --      Excl. in GC? --    Constitutional: Alert and oriented. Well appearing and in no distress. Eyes: Conjunctivae are normal. Normal extraocular movements. Musculoskeletal: Tenderness of the right hip, mild pain with range of motion of the right hip. Neurologic:  Normal speech and language. No gross focal neurologic deficits are appreciated.  Skin:  Skin is warm, dry and intact. No rash noted. Psychiatric: Mood and affect are normal. Speech and behavior are normal.  ____________________________________________  ED COURSE:  As part of my medical decision making, I reviewed the following data within the electronic MEDICAL RECORD NUMBER History obtained from family if available, nursing notes, old chart and ekg, as well as notes from prior ED visits. Patient presented for right hip pain, we will assess with labs and imaging as indicated at this time.  Procedures ____________________________________________   LABS (pertinent positives/negatives)  Labs Reviewed  POCT PREGNANCY, URINE    RADIOLOGY Images were viewed by me  Right hip x-rays Are unremarkable ____________________________________________  DIFFERENTIAL DIAGNOSIS   Muscle strain, spasm, contusion  FINAL ASSESSMENT AND PLAN  Right hip pain   Plan: The patient had presented for right hip pain. Patient's labs did not reveal any pregnancy. Patient's imaging are unremarkable.  Should will be discharged with Motrin and Valium and is stable for outpatient follow-up.   Ulice Dash,  MD   Note: This note was generated in part or whole with voice recognition software. Voice recognition is usually quite accurate but there are transcription errors that can and very often do occur. I apologize for any typographical errors that were not detected and corrected.     Emily Filbert, MD 08/06/17 [id number]

## 2017-08-06 NOTE — ED Triage Notes (Addendum)
Pt to triage via w/c with no distress; st pain to right lower back/hip that began after getting into bed tonight; denies hx of same; denies any accomp symptoms

## 2017-08-06 NOTE — ED Notes (Addendum)
Pt noted leaving ED lobby with SO; SO returns to lobby with empty w/c and leaves again

## 2017-08-06 NOTE — ED Notes (Signed)
Pt voiced concern about validity of POC urine due to mother needing blood tests in order to validate pregnancy. MD Mayford Knife notified.

## 2017-08-06 NOTE — ED Notes (Signed)
Sleeping soundly in lobby with no distress noted 

## 2017-08-22 ENCOUNTER — Other Ambulatory Visit: Payer: Self-pay

## 2017-08-22 ENCOUNTER — Emergency Department
Admission: EM | Admit: 2017-08-22 | Discharge: 2017-08-22 | Disposition: A | Discharge status: Home / Self Care | Payer: Self-pay | Attending: Emergency Medicine | Admitting: Emergency Medicine

## 2017-08-22 ENCOUNTER — Telehealth: Payer: Self-pay

## 2017-08-22 ENCOUNTER — Encounter: Payer: Self-pay | Admitting: Emergency Medicine

## 2017-08-22 DIAGNOSIS — R45851 Suicidal ideations: Secondary | ICD-10-CM | POA: Insufficient documentation

## 2017-08-22 DIAGNOSIS — F319 Bipolar disorder, unspecified: Secondary | ICD-10-CM | POA: Insufficient documentation

## 2017-08-22 DIAGNOSIS — F17210 Nicotine dependence, cigarettes, uncomplicated: Secondary | ICD-10-CM | POA: Insufficient documentation

## 2017-08-22 DIAGNOSIS — S61519A Laceration without foreign body of unspecified wrist, initial encounter: Secondary | ICD-10-CM

## 2017-08-22 DIAGNOSIS — F4320 Adjustment disorder, unspecified: Secondary | ICD-10-CM

## 2017-08-22 DIAGNOSIS — X789XXA Intentional self-harm by unspecified sharp object, initial encounter: Secondary | ICD-10-CM

## 2017-08-22 DIAGNOSIS — F32A Depression, unspecified: Secondary | ICD-10-CM

## 2017-08-22 DIAGNOSIS — J45909 Unspecified asthma, uncomplicated: Secondary | ICD-10-CM | POA: Insufficient documentation

## 2017-08-22 DIAGNOSIS — F329 Major depressive disorder, single episode, unspecified: Secondary | ICD-10-CM

## 2017-08-22 DIAGNOSIS — F4325 Adjustment disorder with mixed disturbance of emotions and conduct: Secondary | ICD-10-CM

## 2017-08-22 LAB — ACETAMINOPHEN LEVEL: Acetaminophen (Tylenol), Serum: <10 ug/mL — ABNORMAL LOW (ref 10–30)

## 2017-08-22 LAB — URINE DRUG SCREEN, QUALITATIVE (ARMC ONLY)
Amphetamines, Ur Screen: NOT DETECTED
BARBITURATES, UR SCREEN: NOT DETECTED
Benzodiazepine, Ur Scrn: NOT DETECTED
COCAINE METABOLITE, UR ~~LOC~~: NOT DETECTED
Cannabinoid 50 Ng, Ur ~~LOC~~: POSITIVE — AB
MDMA (ECSTASY) UR SCREEN: NOT DETECTED
METHADONE SCREEN, URINE: NOT DETECTED
OPIATE, UR SCREEN: POSITIVE — AB
Phencyclidine (PCP) Ur S: NOT DETECTED
TRICYCLIC, UR SCREEN: NOT DETECTED

## 2017-08-22 LAB — CBC
HEMATOCRIT: 49.1 % — AB (ref 35.0–47.0)
Hemoglobin: 16.9 g/dL — ABNORMAL HIGH (ref 12.0–16.0)
MCH: 30.6 pg (ref 26.0–34.0)
MCHC: 34.4 g/dL (ref 32.0–36.0)
MCV: 89 fL (ref 80.0–100.0)
Platelets: 248 10*3/uL (ref 150–440)
RBC: 5.52 MIL/uL — AB (ref 3.80–5.20)
RDW: 13.1 % (ref 11.5–14.5)
WBC: 14.2 10*3/uL — ABNORMAL HIGH (ref 3.6–11.0)

## 2017-08-22 LAB — COMPREHENSIVE METABOLIC PANEL
ALBUMIN: 4.8 g/dL (ref 3.5–5.0)
ALT: 18 U/L (ref 14–54)
ANION GAP: 12 (ref 5–15)
AST: 26 U/L (ref 15–41)
Alkaline Phosphatase: 79 U/L (ref 38–126)
BILIRUBIN TOTAL: 0.6 mg/dL (ref 0.3–1.2)
BUN: 12 mg/dL (ref 6–20)
CALCIUM: 9.4 mg/dL (ref 8.9–10.3)
CO2: 24 mmol/L (ref 22–32)
Chloride: 101 mmol/L (ref 101–111)
Creatinine, Ser: 0.69 mg/dL (ref 0.44–1.00)
GFR calc Af Amer: >60 mL/min (ref >60)
GLUCOSE: 107 mg/dL — AB (ref 65–99)
Potassium: 3.7 mmol/L (ref 3.5–5.1)
Sodium: 137 mmol/L (ref 135–145)
TOTAL PROTEIN: 8.2 g/dL — AB (ref 6.5–8.1)

## 2017-08-22 LAB — ETHANOL: Alcohol, Ethyl (B): <10 mg/dL (ref <10)

## 2017-08-22 LAB — SALICYLATE LEVEL: Salicylate Lvl: <7 mg/dL (ref 2.8–30.0)

## 2017-08-22 MED ORDER — ARIPIPRAZOLE 5 MG PO TABS: 5.0000 mg | ORAL_TABLET | Freq: Every day | ORAL | 1 refills | Status: DC
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
## 2017-08-22 NOTE — ED Notes (Signed)
Pt discharged to lobby (mother). Pt given discharge instructions and prescription. All belongings returned to patient. VS stable. Pt denies SI/HI.

## 2017-08-22 NOTE — Consult Note (Signed)
[place] [facility] Consult   Reason for Consult: Consult for 22 year old woman with a history of mood instability brought to the hospital after cutting herself very superficially on the wrist Referring Physician: [facility] Patient Identification: Chloe Hopkins MRN:  [id number] Principal Diagnosis: Adjustment disorder Diagnosis:   Patient Active Problem List   Diagnosis Date Noted  . Self-inflicted laceration of wrist [S61.519A] 08/22/2017  . Bipolar disorder ([place]) [F31.9] 08/22/2017  . Adjustment disorder [F43.20] 08/22/2017    Total Time spent with patient: 1 hour  Subjective:   Chloe Hopkins is a 22 y.o. female patient admitted with "I do not think I need to be in the hospital for this".  HPI: Patient seen chart reviewed.  22 year old woman brought into the hospital after she cut herself superficially on the wrist.  Patient says that she was in more or less her normal state of health until this morning she got a text message from her boyfriend's parents demanding that she do something for them.  She says this is the sort of thing that sets her off because she finds it so inconsiderate.  She grabbed a knife in the kitchen and scratched herself very superficially on the wrist before her boyfriend took the knife away and had her brought to the hospital.  Patient says her mood is always somewhat irritable and on edge.  Sleep however is normal.  Appetite is normal.  She does not report any psychotic symptoms.  Patient denies having any actual wish to die.  No homicidal ideation.  She is not receiving any outpatient psychiatric treatment currently for her history of chronic mood instability.  Denies drug or alcohol abuse.  Medical history: Patient says she was diagnosed with pseudotumor cerebri when she was much younger.  Minimal or no trouble from it now.  Substance abuse history: Drinks rarely if at all.  Not drinking today.  Only infrequent marijuana use.  None in weeks.  Social  history: Living with her boyfriend.  Not currently working having lost her job a few weeks ago.  Mother also lives nearby.  She says she has a good relationship with her boyfriend and her family of origin.    Past Psychiatric History: Patient has no previous psychiatric hospitalizations no history of suicide attempts.  She has cut her wrist a couple of times in the past.  Patient used to see a psychiatrist particularly was once evaluated in our clinic.  Had a diagnosis of bipolar disorder and was treated with antidepressants and mood stabilizers.  Patient remembers Abilify having been particularly helpful in the past.  She also has a past history of ADHD.  She is not currently seeing anyone for mental health treatment and not on any medicine.  Risk to Self: Suicidal Ideation: No-Not Currently/Within Last 6 Months Suicidal Intent: No-Not Currently/Within Last 6 Months Is patient at risk for suicide?: No Suicidal Plan?: No-Not Currently/Within Last 6 Months Access to Means: No What has been your use of drugs/alcohol within the last 12 months?: Cannabis How many times?: 1 Other Self Harm Risks: Reports of none Triggers for Past Attempts: None known Intentional Self Injurious Behavior: None Risk to Others: Homicidal Ideation: No Thoughts of Harm to Others: No Current Homicidal Intent: No Current Homicidal Plan: No Access to Homicidal Means: No Identified Victim: Reports of none History of harm to others?: No Assessment of Violence: None Noted Violent Behavior Description: Report of none Does patient have access to weapons?: No Criminal Charges Pending?: No Does patient have a court  date: No Prior Inpatient Therapy: Prior Inpatient Therapy: No Prior Outpatient Therapy: Prior Outpatient Therapy: No Does patient have an ACCT team?: No Does patient have Intensive In-House Services?  : No Does patient have [facility] services? : No Does patient have P4CC services?: No  Past Medical History:    Past Medical History:  Diagnosis Date  . ADHD (attention deficit hyperactivity disorder)   . Anxiety   . Asthma   . Bipolar disorder ([place])   . Depression   . History of fainting spells of unknown cause   . Migraine headache   . Seizures ([place])     Past Surgical History:  Procedure Laterality Date  . CHOLECYSTECTOMY  2012  . TONSILLECTOMY AND ADENOIDECTOMY     Family History:  Family History  Problem Relation Age of Onset  . Diabetes Mother   . Bipolar disorder Father    Family Psychiatric  History: Does not know of any Social History:  Social History   Substance and Sexual Activity  Alcohol Use Yes   Comment: "barely"     Social History   Substance and Sexual Activity  Drug Use No    Social History   Socioeconomic History  . Marital status: Single    Spouse name: Not on file  . Number of children: Not on file  . Years of education: 43  . Highest education level: Not on file  Occupational History  . Not on file  Social Needs  . Financial resource strain: Not on file  . Food insecurity:    Worry: Not on file    Inability: Not on file  . Transportation needs:    Medical: Not on file    Non-medical: Not on file  Tobacco Use  . Smoking status: Current Every Day Smoker    Packs/day: 1.00    Types: Cigarettes  . Smokeless tobacco: Never Used  Substance and Sexual Activity  . Alcohol use: Yes    Comment: "barely"  . Drug use: No  . Sexual activity: Yes    Birth control/protection: None  Lifestyle  . Physical activity:    Days per week: Not on file    Minutes per session: Not on file  . Stress: Not on file  Relationships  . Social connections:    Talks on phone: Not on file    Gets together: Not on file    Attends religious service: Not on file    Active member of club or organization: Not on file    Attends meetings of clubs or organizations: Not on file    Relationship status: Not on file  Other Topics Concern  . Not on file  Social History  Narrative  . Not on file   Additional Social History:    Allergies:   Allergies  Allergen Reactions  . Coconut Oil     Labs:  Results for orders placed or performed during the hospital encounter of 08/22/17 (from the past 48 hour(s))  Comprehensive metabolic panel     Status: Abnormal   Collection Time: 08/22/17 12:27 PM  Result Value Ref Range   Sodium 137 135 - 145 mmol/L   Potassium 3.7 3.5 - 5.1 mmol/L   Chloride 101 101 - 111 mmol/L   CO2 24 22 - 32 mmol/L   Glucose, Bld 107 (H) 65 - 99 mg/dL   BUN 12 6 - 20 mg/dL   Creatinine, Ser 0.69 0.44 - 1.00 mg/dL   Calcium 9.4 8.9 - 10.3 mg/dL   Total  Protein 8.2 (H) 6.5 - 8.1 g/dL   Albumin 4.8 3.5 - 5.0 g/dL   AST 26 15 - 41 U/L   ALT 18 14 - 54 U/L   Alkaline Phosphatase 79 38 - 126 U/L   Total Bilirubin 0.6 0.3 - 1.2 mg/dL   GFR calc non Af Amer >60 >60 mL/min   GFR calc Af Amer >60 >60 mL/min    Comment: (NOTE) The eGFR has been calculated using the CKD EPI equation. This calculation has not been validated in all clinical situations. eGFR's persistently <60 mL/min signify possible Chronic Kidney Disease.    Anion gap 12 5 - 15    Comment: Performed at [facility], [address] [place], [place] [address]  Ethanol     Status: None   Collection Time: 08/22/17 12:27 PM  Result Value Ref Range   Alcohol, Ethyl (B) <10 <10 mg/dL    Comment: (NOTE) Lowest detectable limit for serum alcohol is 10 mg/dL. For medical purposes only. Performed at [facility], [address] [place], [place] [address]   Salicylate level     Status: None   Collection Time: 08/22/17 12:27 PM  Result Value Ref Range   Salicylate Lvl <8.4 2.8 - 30.0 mg/dL    Comment: Performed at [facility], [place], [place] [address]  Acetaminophen level     Status: Abnormal   Collection Time: 08/22/17 12:27 PM  Result Value Ref Range   Acetaminophen (Tylenol), Serum <10 (L) 10 - 30 ug/mL    Comment:  (NOTE) Therapeutic concentrations vary significantly. A range of 10-30 ug/mL  may be an effective concentration for many patients. However, some  are best treated at concentrations outside of this range. Acetaminophen concentrations >150 ug/mL at 4 hours after ingestion  and >50 ug/mL at 12 hours after ingestion are often associated with  toxic reactions. Performed at [facility], [address] [place], [place] [address]   cbc     Status: Abnormal   Collection Time: 08/22/17 12:27 PM  Result Value Ref Range   WBC 14.2 (H) 3.6 - 11.0 K/uL   RBC 5.52 (H) 3.80 - 5.20 MIL/uL   Hemoglobin 16.9 (H) 12.0 - 16.0 g/dL   HCT 49.1 (H) 35.0 - 47.0 %   MCV 89.0 80.0 - 100.0 fL   MCH 30.6 26.0 - 34.0 pg   MCHC 34.4 32.0 - 36.0 g/dL   RDW 13.1 11.5 - 14.5 %   Platelets 248 150 - 440 K/uL    Comment: Performed at [facility], [address] [place], [place] [address]  Urine Drug Screen, Qualitative     Status: Abnormal   Collection Time: 08/22/17 12:27 PM  Result Value Ref Range   Tricyclic, Ur Screen NONE DETECTED NONE DETECTED   Amphetamines, Ur Screen NONE DETECTED NONE DETECTED   MDMA (Ecstasy)Ur Screen NONE DETECTED NONE DETECTED   Cocaine Metabolite,Ur [place] NONE DETECTED NONE DETECTED   Opiate, Ur Screen POSITIVE (A) NONE DETECTED   Phencyclidine (PCP) Ur S NONE DETECTED NONE DETECTED   Cannabinoid 50 Ng, Ur [place] POSITIVE (A) NONE DETECTED   Barbiturates, Ur Screen NONE DETECTED NONE DETECTED   Benzodiazepine, Ur Scrn NONE DETECTED NONE DETECTED   Methadone Scn, Ur NONE DETECTED NONE DETECTED    Comment: (NOTE) Tricyclics + metabolites, urine    Cutoff 1000 ng/mL Amphetamines + metabolites, urine  Cutoff 1000 ng/mL MDMA (Ecstasy), urine  Cutoff 500 ng/mL Cocaine Metabolite, urine          Cutoff 300 ng/mL Opiate + metabolites, urine        Cutoff 300 ng/mL Phencyclidine (PCP), urine         Cutoff 25 ng/mL Cannabinoid, urine                 Cutoff 50  ng/mL Barbiturates + metabolites, urine  Cutoff 200 ng/mL Benzodiazepine, urine              Cutoff 200 ng/mL Methadone, urine                   Cutoff 300 ng/mL The urine drug screen provides only a preliminary, unconfirmed analytical test result and should not be used for non-medical purposes. Clinical consideration and professional judgment should be applied to any positive drug screen result due to possible interfering substances. A more specific alternate chemical method must be used in order to obtain a confirmed analytical result. Gas chromatography / mass spectrometry (GC/MS) is the preferred confirmat ory method. Performed at [facility] [address] [place], [place] [address]     No current facility-administered medications for this encounter.    Current Outpatient Medications  Medication Sig Dispense Refill  . ARIPiprazole (ABILIFY) 5 MG tablet Take 1 tablet (5 mg total) by mouth daily. 30 tablet 1  . diazepam (VALIUM) 5 MG tablet Take 1 tablet (5 mg total) by mouth every 8 (eight) hours as needed for muscle spasms. 20 tablet 0  . ibuprofen (ADVIL,MOTRIN) 800 MG tablet Take 1 tablet (800 mg total) by mouth every 8 (eight) hours as needed. (Patient not taking: Reported on 08/06/2017) 30 tablet 0  . ibuprofen (ADVIL,MOTRIN) 800 MG tablet Take 1 tablet (800 mg total) by mouth every 8 (eight) hours as needed. 30 tablet 0    Musculoskeletal: Strength & Muscle Tone: within normal limits Gait & Station: normal Patient leans: N/A  Psychiatric Specialty Exam: Physical Exam  Nursing note and vitals reviewed. Constitutional: She appears well-developed and well-nourished.  HENT:  Head: Normocephalic and atraumatic.  Eyes: Pupils are equal, round, and reactive to light. Conjunctivae are normal.  Neck: Normal range of motion.  Cardiovascular: Regular rhythm and normal heart sounds.  Respiratory: Effort normal. No respiratory distress.  GI: Soft.    Musculoskeletal: Normal range of motion.  Neurological: She is alert.  Skin: Skin is warm and dry.     Psychiatric: She has a normal mood and affect. Her speech is normal and behavior is normal. Judgment and thought content normal. Cognition and memory are normal.    Review of Systems  Constitutional: Negative.   HENT: Negative.   Eyes: Negative.   Respiratory: Negative.   Cardiovascular: Negative.   Gastrointestinal: Negative.   Musculoskeletal: Negative.   Skin: Negative.   Neurological: Negative.   Psychiatric/Behavioral: Negative for depression, hallucinations, memory loss, substance abuse and suicidal ideas. The patient is nervous/anxious. The patient does not have insomnia.     Blood pressure (!) 138/91, pulse (!) 106, temperature 98.2 F (36.8 C), temperature source Oral, resp. rate 18, height '5\' 1"'$  (1.549 m), weight 71.2 kg (157 lb), last menstrual period 08/17/2017, SpO2 97 %.Body mass index is 29.66 kg/m.  General Appearance: Casual  Eye Contact:  Good  Speech:  Clear and Coherent  Volume:  Normal  Mood:  Euthymic  Affect:  Constricted  Thought Process:  Goal Directed  Orientation:  Full (Time, Place, and Person)  Thought Content:  Logical  Suicidal Thoughts:  No  Homicidal Thoughts:  No  Memory:  Immediate;   Fair Recent;   Fair Remote;   Fair  Judgement:  Fair  Insight:  Fair  Psychomotor Activity:  Decreased  Concentration:  Concentration: Fair  Recall:  [employer] of Knowledge:  Fair  Language:  Fair  Akathisia:  No  Handed:  Right  AIMS (if indicated):     Assets:  Desire for Improvement Housing Physical Health Resilience Social Support  ADL's:  Intact  Cognition:  WNL  Sleep:        Treatment Plan Summary: Medication management and Plan Patient is not actively suicidal.  She is calm and lucid.  No sign of psychosis.  Patient does not appear to currently meet commitment criteria.  Discontinued IV C.  Patient was educated about the treatment  of mood instability and the availability of treatment at [facility].  She is given referral information.  I also agreed to give her a prescription to restart 5 mg of Abilify for mood instability.  Side effects discussed.  Patient's case was reviewed with emergency room doctor and TTS.  Disposition: No evidence of imminent risk to self or others at present.   Patient does not meet criteria for psychiatric inpatient admission. Supportive therapy provided about ongoing stressors.  Alethia Berthold, MD 08/22/2017 5:07 PM

## 2017-08-22 NOTE — ED Notes (Signed)
Pt to be discharged home today. Maintained on 15 minute checks and observation by security camera for safety.

## 2017-08-22 NOTE — Telephone Encounter (Signed)
Pt's mom called stating pt was actively trying to "kill herself with a knife".  Chloe Hopkins (pt's mom) stated that there were able to get the knife away from her and is "calm" right now.  Mom was wanting to know what she should do, I advised her to immediately take pt to the ED.  She agreed and will put the pt in her car and drive her to the ED.  Thanks,   -Vernona RiegerLaura

## 2017-08-22 NOTE — BH Assessment (Signed)
Assessment Note  Chloe Hopkins is an 22 y.o. female who presents to ER due to her boyfriend and mother having concerns about her recent self-injurious behaviors. Today (08/22/2017) patient cut her wrist with a knife. Patient states she has had thoughts of ending her life but haven't had the courage to follow through with it. On today (08/22/2017), she cut her arm due to being upset with her boyfriend and upset because the recent job loss. Patient further reports of having a history of anxiety and being impulsive since grade school. Her PCP currently prescribe medications to address her mood swings. She states she was diagnosed with bipolar when she was a child.  During the interview, the patient was cooperative and pleasant. She admits to having had thoughts about ending her life, currently she denies SI/HI and AV/H. She states her loves one are overacting and denies having any intentions of killing herself. Patient denies current use of any mind-altering substances. She reports she last use cannabis approximately a month ago.  Diagnosis: Bipolar  Past Medical History:  Past Medical History:  Diagnosis Date  . ADHD (attention deficit hyperactivity disorder)   . Anxiety   . Asthma   . Bipolar disorder ([facility])   . Depression   . History of fainting spells of unknown cause   . Migraine headache   . Seizures ([facility])     Past Surgical History:  Procedure Laterality Date  . CHOLECYSTECTOMY  2012  . TONSILLECTOMY AND ADENOIDECTOMY      Family History:  Family History  Problem Relation Age of Onset  . Diabetes Mother   . Bipolar disorder Father     Social History:  reports that she has been smoking cigarettes.  She has been smoking about 1.00 pack per day. She has never used smokeless tobacco. She reports that she drinks alcohol. She reports that she does not use drugs.  Additional Social History:  Alcohol / Drug Use Pain Medications: See PTA Prescriptions: See PTA Over the Counter: See  PTA History of alcohol / drug use?: Yes Longest period of sobriety (when/how long): Unable to quantify Negative Consequences of Use: Personal relationships Withdrawal Symptoms: (n/a) Substance #1 Name of Substance 1: Cannabis 1 - Last Use / Amount: Unable to quantify  CIWA: CIWA-Ar BP: (!) 138/91 Pulse Rate: (!) 106 COWS:    Allergies:  Allergies  Allergen Reactions  . Coconut Oil     Home Medications:  (Not in a hospital admission)  OB/GYN Status:  Patient's last menstrual period was 08/17/2017.  General Assessment Data Location of Assessment: [facility] ED TTS Assessment: In system Is this a Tele or Face-to-Face Assessment?: Face-to-Face Is this an Initial Assessment or a Re-assessment for this encounter?: Initial Assessment Marital status: Long term relationship Maiden name: n/a Is patient pregnant?: No Pregnancy Status: No Living Arrangements: Spouse/significant other(Lives with boyfriend) Can pt return to current living arrangement?: Yes Admission Status: Voluntary Is patient capable of signing voluntary admission?: Yes Referral Source: Self/Family/Friend Insurance type: Reports of none  Medical Screening Exam [facility]) Medical Exam completed: Yes  Crisis Care Plan Living Arrangements: Spouse/significant other(Lives with boyfriend) Legal Guardian: Other:(Self) Name of Psychiatrist: Reports of none Name of Therapist: Reports of none  Education Status Is patient currently in school?: No Is the patient employed, unemployed or receiving disability?: Unemployed  Risk to self with the past 6 months Suicidal Ideation: No-Not Currently/Within Last 6 Months Has patient been a risk to self within the past 6 months prior to admission? :  No Suicidal Intent: No-Not Currently/Within Last 6 Months Has patient had any suicidal intent within the past 6 months prior to admission? : No Is patient at risk for suicide?: No Suicidal Plan?: No-Not Currently/Within Last 6  Months Has patient had any suicidal plan within the past 6 months prior to admission? : No Access to Means: No What has been your use of drugs/alcohol within the last 12 months?: Cannabis Previous Attempts/Gestures: No How many times?: 1 Other Self Harm Risks: Reports of none Triggers for Past Attempts: None known Intentional Self Injurious Behavior: None Family Suicide History: No Recent stressful life event(s): Other (Comment), Job Loss, Conflict (Comment) Persecutory voices/beliefs?: No Depression: Yes Depression Symptoms: Feeling worthless/self pity, Guilt, Isolating Substance abuse history and/or treatment for substance abuse?: Yes Suicide prevention information given to non-admitted patients: Not applicable  Risk to Others within the past 6 months Homicidal Ideation: No Does patient have any lifetime risk of violence toward others beyond the six months prior to admission? : No Thoughts of Harm to Others: No Current Homicidal Intent: No Current Homicidal Plan: No Access to Homicidal Means: No Identified Victim: Reports of none History of harm to others?: No Assessment of Violence: None Noted Violent Behavior Description: Report of none Does patient have access to weapons?: No Criminal Charges Pending?: No Does patient have a court date: No Is patient on probation?: No  Psychosis Hallucinations: None noted Delusions: None noted  Mental Status Report Appearance/Hygiene: Unremarkable, In scrubs Eye Contact: Good Motor Activity: Freedom of movement, Unremarkable Speech: Logical/coherent, Unremarkable Level of Consciousness: Alert Mood: Anxious, Sad, Pleasant Affect: Appropriate to circumstance, Anxious Anxiety Level: Minimal Thought Processes: Coherent, Relevant Judgement: Unimpaired Orientation: Person, Place, Time, Situation, Appropriate for developmental age Obsessive Compulsive Thoughts/Behaviors: Minimal  Cognitive Functioning Concentration: Normal Memory:  Recent Intact, Remote Intact Is patient IDD: No Is patient DD?: No Insight: Fair Impulse Control: Fair Appetite: Fair Have you had any weight changes? : No Change Sleep: No Change Total Hours of Sleep: 8 Vegetative Symptoms: None  ADLScreening [facility] Assessment Services) Patient's cognitive ability adequate to safely complete daily activities?: Yes Patient able to express need for assistance with ADLs?: Yes Independently performs ADLs?: Yes (appropriate for developmental age)  Prior Inpatient Therapy Prior Inpatient Therapy: No  Prior Outpatient Therapy Prior Outpatient Therapy: No Does patient have an ACCT team?: No Does patient have Intensive In-House Services?  : No Does patient have [facility] services? : No Does patient have P4CC services?: No  ADL Screening (condition at time of admission) Patient's cognitive ability adequate to safely complete daily activities?: Yes Is the patient deaf or have difficulty hearing?: No Does the patient have difficulty seeing, even when wearing glasses/contacts?: No Does the patient have difficulty concentrating, remembering, or making decisions?: No Patient able to express need for assistance with ADLs?: Yes Does the patient have difficulty dressing or bathing?: No Independently performs ADLs?: Yes (appropriate for developmental age) Does the patient have difficulty walking or climbing stairs?: No Weakness of Legs: None Weakness of Arms/Hands: None  Home Assistive Devices/Equipment Home Assistive Devices/Equipment: None  Therapy Consults (therapy consults require a physician order) PT Evaluation Needed: No OT Evalulation Needed: No SLP Evaluation Needed: No Abuse/Neglect Assessment (Assessment to be complete while patient is alone) Abuse/Neglect Assessment Can Be Completed: Yes Physical Abuse: Denies Verbal Abuse: Denies Sexual Abuse: Denies Exploitation of patient/patient's resources: Denies Self-Neglect: Denies Values /  Beliefs Cultural Requests During Hospitalization: None Spiritual Requests During Hospitalization: None Consults Spiritual Care Consult Needed: No Social Work  Consult Needed: No         Child/Adolescent Assessment Running Away Risk: Denies(Patient is an adult)  Disposition:  Disposition Initial Assessment Completed for this Encounter: Yes  On Site Evaluation by:   Reviewed with Physician:    Lilyan Gilford MS, LCAS, LPC, NCC, CCSI Therapeutic Triage Specialist 08/22/2017 2:11 PM

## 2017-08-22 NOTE — Discharge Instructions (Signed)
You have been seen in the emergency department for a  psychiatric concern. You have been evaluated both medically as well as psychiatrically. Please follow-up with your outpatient resources provided. Return to the emergency department for any worsening symptoms, or any thoughts of hurting yourself or anyone else so that we may attempt to help you. 

## 2017-08-22 NOTE — ED Triage Notes (Signed)
Arrives, tearful.  States she tried to hurt herself today by cutting self.  Superficial scratch seen to right anterior forearm.  Patient is calm and cooperative.  NAD

## 2017-08-22 NOTE — Telephone Encounter (Signed)
Agree needs ER due to acuity

## 2017-08-22 NOTE — ED Provider Notes (Signed)
[facility] Emergency Department Provider Note  Time seen: 12:50 PM  I have reviewed the triage vital signs and the nursing notes.   HISTORY  Chief Complaint Suicidal    HPI Chloe Hopkins is a 22 y.o. female with a past medical history of ADHD, anxiety, bipolar, presents to the emergency department for suicidal ideation.  According to the patient she has had worsening depression for the past several weeks, has not been taking any medications.  States today she "freaked out" tried to cut her wrist with a knife.  Patient has extremely superficial cuts to the right wrist, hemostatic.  States she is having thoughts of killing herself.  Denies any alcohol use.  States occasional marijuana but denies any other substance use.  No homicidal ideation.   Past Medical History:  Diagnosis Date  . ADHD (attention deficit hyperactivity disorder)   . Anxiety   . Asthma   . Bipolar disorder (HCC)   . Depression   . History of fainting spells of unknown cause   . Migraine headache   . Seizures (HCC)     There are no active problems to display for this patient.   Past Surgical History:  Procedure Laterality Date  . CHOLECYSTECTOMY  2012  . TONSILLECTOMY AND ADENOIDECTOMY      Prior to Admission medications   Medication Sig Start Date End Date Taking? Authorizing Provider  diazepam (VALIUM) 5 MG tablet Take 1 tablet (5 mg total) by mouth every 8 (eight) hours as needed for muscle spasms. 08/06/17   Emily Filbert, MD  ibuprofen (ADVIL,MOTRIN) 800 MG tablet Take 1 tablet (800 mg total) by mouth every 8 (eight) hours as needed. Patient not taking: Reported on 08/06/2017 04/18/17   Faythe Ghee, PA-C  ibuprofen (ADVIL,MOTRIN) 800 MG tablet Take 1 tablet (800 mg total) by mouth every 8 (eight) hours as needed. 08/06/17   Emily Filbert, MD    Allergies  Allergen Reactions  . Coconut Oil     Family History  Problem Relation Age of Onset  . Diabetes  Mother   . Bipolar disorder Father     Social History Social History   Tobacco Use  . Smoking status: Current Every Day Smoker    Packs/day: 1.00    Types: Cigarettes  . Smokeless tobacco: Never Used  Substance Use Topics  . Alcohol use: Yes    Comment: "barely"  . Drug use: No    Review of Systems Constitutional: Negative for fever. Eyes: Negative for visual complaints ENT: Negative for recent illness/congestion Cardiovascular: Negative for chest pain. Respiratory: Negative for shortness of breath. Gastrointestinal: Negative for abdominal pain.  One episode of vomiting this morning.  Negative diarrhea. Genitourinary: Negative for urinary compaints.  Last menstrual period was within the past 1 week. Musculoskeletal: Negative for musculoskeletal complaints Skin: Negative for skin complaints  Neurological: Negative for headache All other ROS negative  ____________________________________________   PHYSICAL EXAM:  VITAL SIGNS: ED Triage Vitals  Enc Vitals Group     BP 08/22/17 1222 (!) 138/91     Pulse Rate 08/22/17 1222 (!) 106     Resp 08/22/17 1222 18     Temp 08/22/17 1222 98.2 F (36.8 C)     Temp Source 08/22/17 1222 Oral     SpO2 08/22/17 1222 97 %     Weight 08/22/17 1217 157 lb (71.2 kg)     Height 08/22/17 1217  (1.549 m)     Head Circumference --  Peak Flow --      Pain Score 08/22/17 1217 0     Pain Loc --      Pain Edu? --      Excl. in GC? --     Constitutional: Alert and oriented. Well appearing, but tearful during exam. Eyes: Normal exam ENT   Head: Normocephalic and atraumatic   Mouth/Throat: Mucous membranes are moist. Cardiovascular: Normal rate, regular rhythm. No murmur Respiratory: Normal respiratory effort without tachypnea nor retractions. Breath sounds are clear  Gastrointestinal: Soft and nontender. No distention.   Musculoskeletal: Nontender with normal range of motion in all extremities. Neurologic:  Normal  speech and language. No gross focal neurologic deficits  Skin:  Skin is warm.  Extremely superficial cut to her right wrist.  Hemostatic.  No repair needed. Psychiatric: Tearful  ____________________________________________   INITIAL IMPRESSION / ASSESSMENT AND PLAN / ED COURSE  Pertinent labs & imaging results that were available during my care of the patient were reviewed by me and considered in my medical decision making (see chart for details).  Patient presents emergency department with suicidal ideation, tearful during my examination.  Patient has extremely superficial cut to her right wrist, hemostatic, does not actually appear to cut through the dermis.  However given her suicidal gesture with suicidal ideation we will place the patient under IVC.  We will consult psychiatry TTS.  We will check labs.  Patient has been seen by psychiatry, they believe the patient is safe for discharge home from psychiatric standpoint.  They will be starting the patient back on medications.  Patient agreeable to this plan of care.  Patient's medical work-up is been largely nonrevealing.  ____________________________________________   FINAL CLINICAL IMPRESSION(S) / ED DIAGNOSES  Suicidal ideation    Minna Antis, MD 08/22/17 1347

## 2017-08-22 NOTE — ED Triage Notes (Signed)
pts boyfriend called her mother and said she took a knife to herself.  Mom reports has had problems with medications not working. Pt admits to SI and does wish to die.  Tearful in triage.  Mom reports that pt left her house at 900 and that is how quickly she decided to do this, mom feels pt is impulsive.

## 2017-08-22 NOTE — ED Notes (Signed)
Dressed out. All belongings given to mother per pt.  wanded by security

## 2017-12-29 IMAGING — CT CT ABD-PELV W/ CM
2 of 4 series · 16 of 46 positions shown, 18 images · IV contrast (APPLIED)
Comparison: None.

CLINICAL DATA: 21-year-old female with lower abdominal pain and
vomiting.

EXAM:
CT ABDOMEN AND PELVIS WITH CONTRAST
TECHNIQUE: Multidetector CT imaging of the abdomen and pelvis was performed
using the standard protocol following bolus administration of
intravenous contrast.
CONTRAST:  100mL B6R3DY-CJJ IOPAMIDOL (B6R3DY-CJJ) INJECTION 61%

[Series 2: routine abd/pel with · axial · 0.71mm/px · z∈[-935,-520]mm · 13 of 91 slices shown, 15 images]
[im 4/91  soft-tissue]
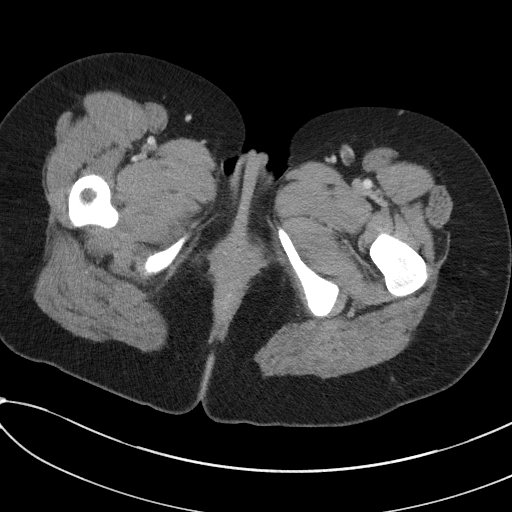
[im 4/91  bone]
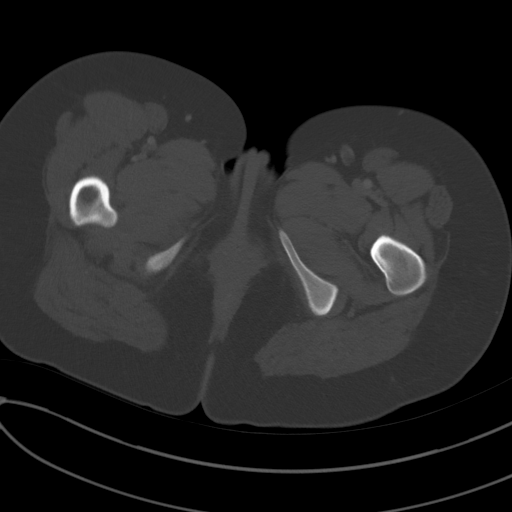
[im 11/91  soft-tissue]
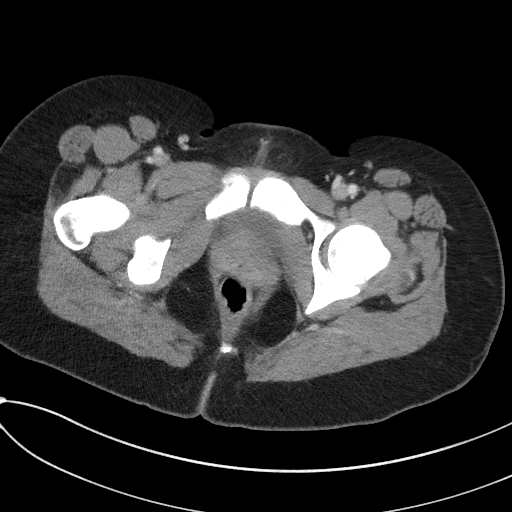
[im 19/91  soft-tissue]
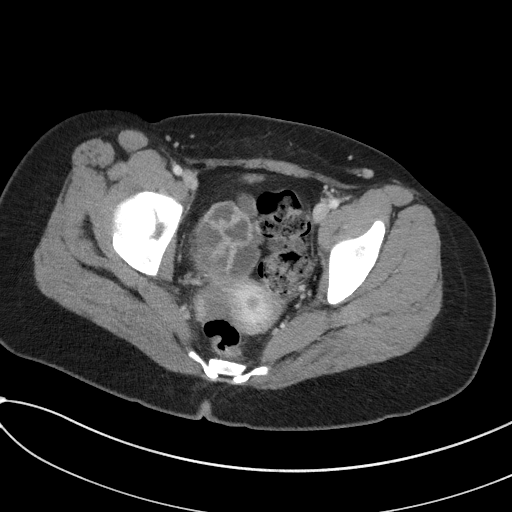
[im 26/91  soft-tissue]
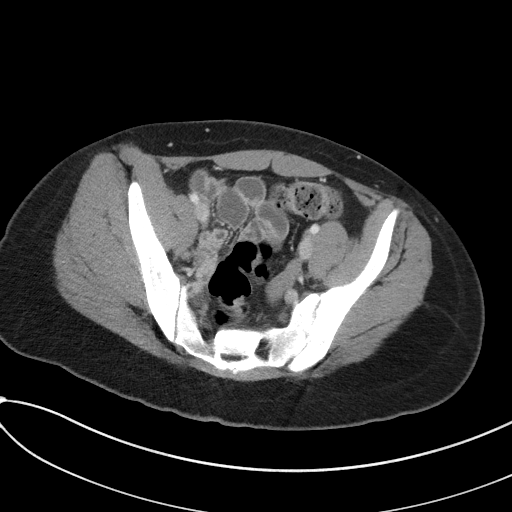
[im 33/91  soft-tissue]
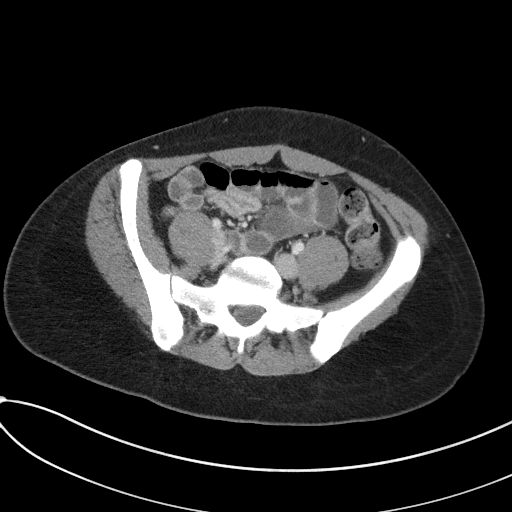
[im 40/91  soft-tissue]
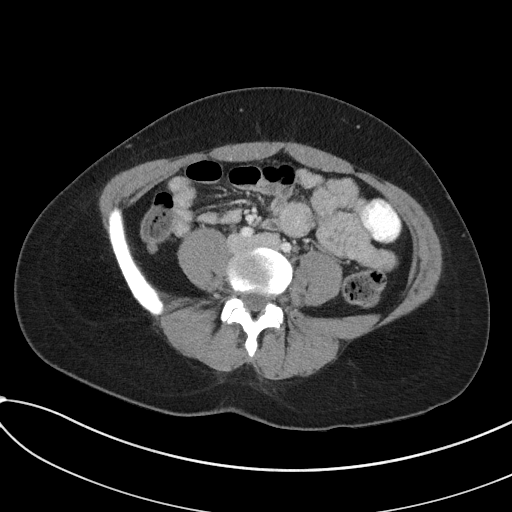
[im 47/91  soft-tissue]
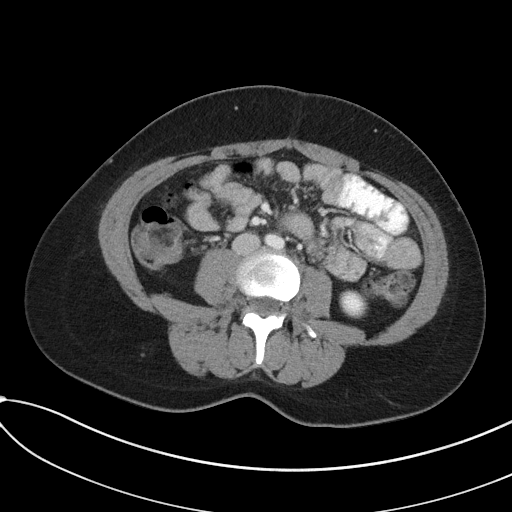
[im 51/91  soft-tissue]
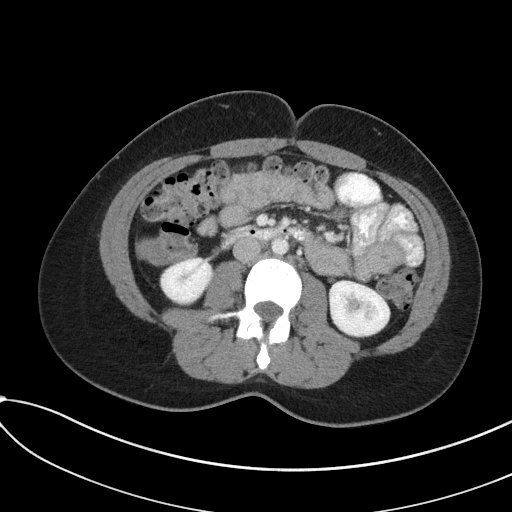
[im 58/91  soft-tissue]
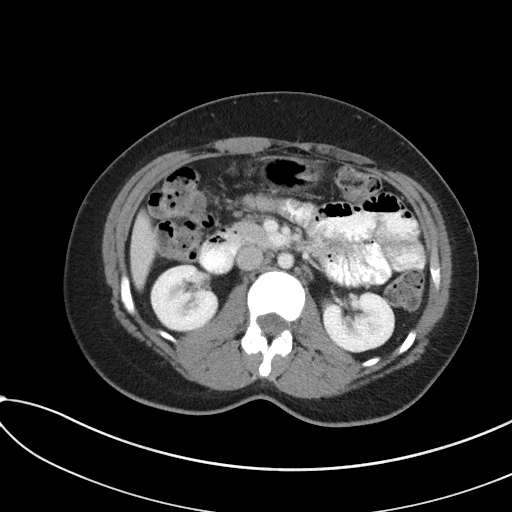
[im 58/91  bone]
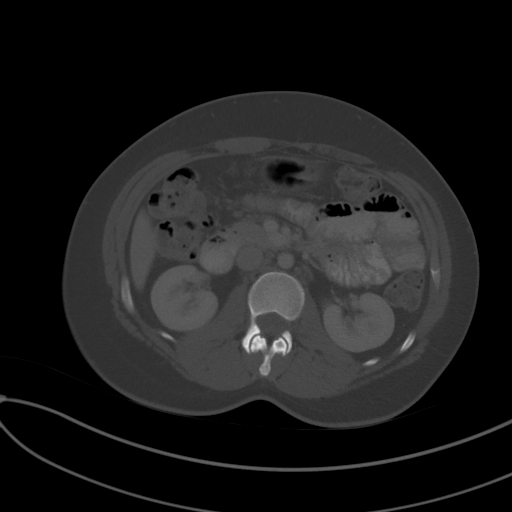
[im 65/91  soft-tissue]
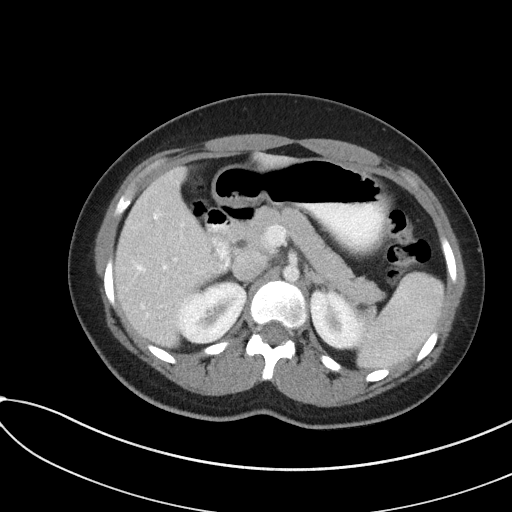
[im 73/91  soft-tissue]
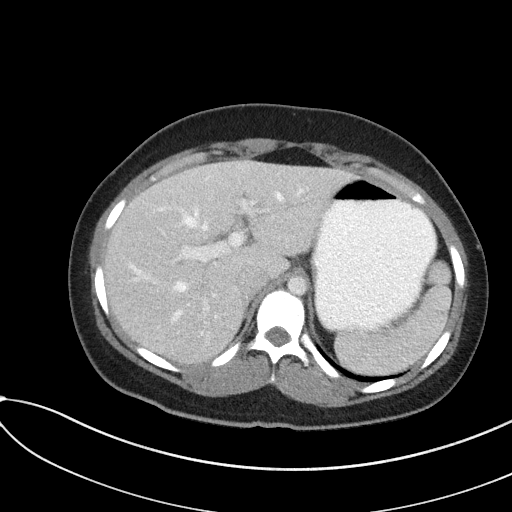
[im 80/91  soft-tissue]
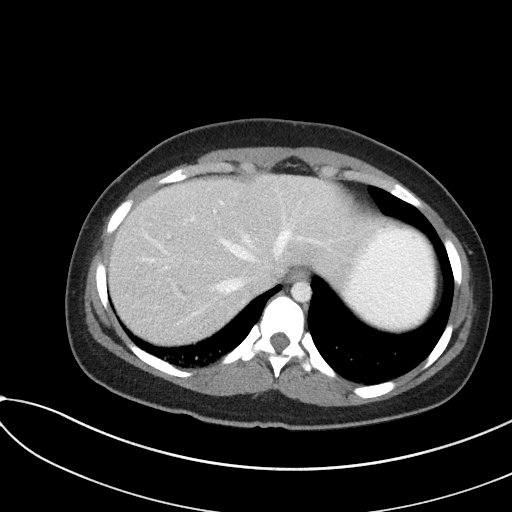
[im 87/91  soft-tissue]
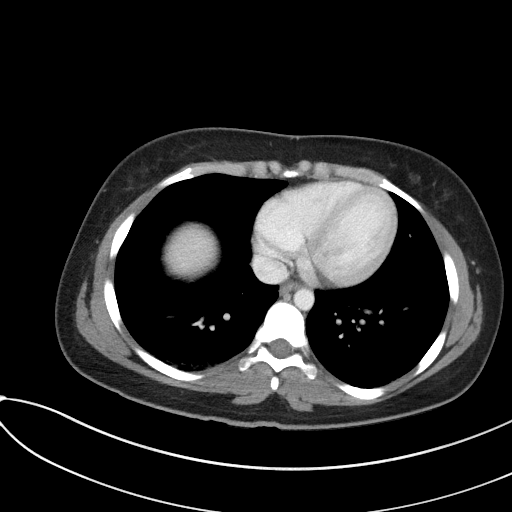

[Series 5: coronal st · coronal · 0.68mm/px · 3 of 79 slices shown]
[im 27/79  soft-tissue]
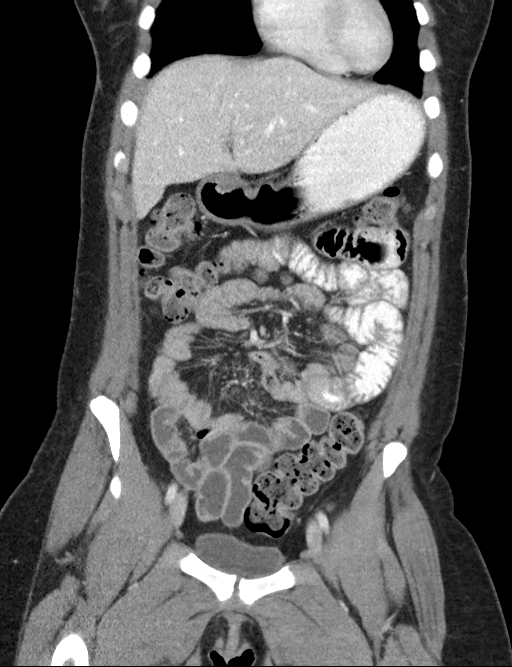
[im 35/79  soft-tissue]
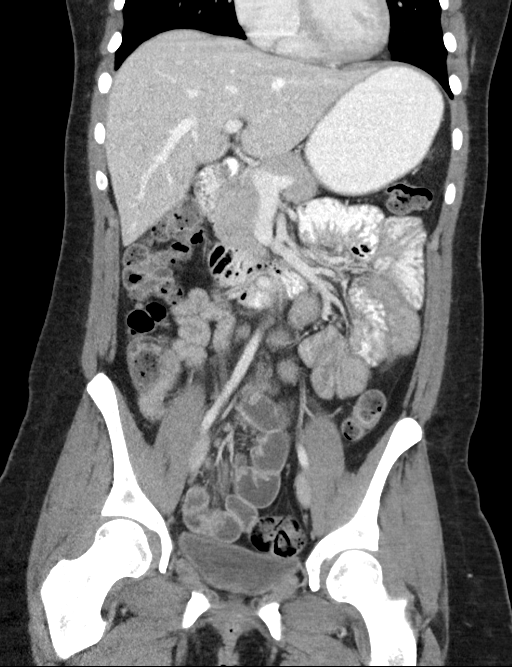
[im 44/79  soft-tissue]
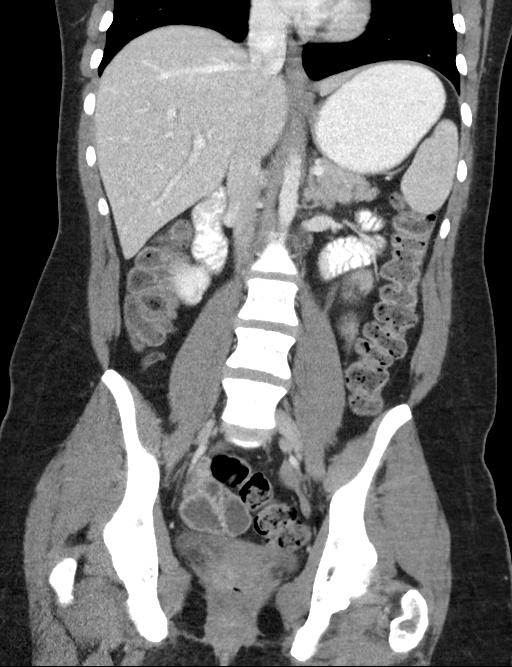

[16 of 46 positions shown; findings below may reference images not displayed]

FINDINGS: Lower chest: The visualized lung bases are clear.

No intra-abdominal free air.  No free fluid.

Hepatobiliary: Cholecystectomy.  The liver is unremarkable.

Pancreas: Unremarkable. No pancreatic ductal dilatation or
surrounding inflammatory changes.

Spleen: Normal in size without focal abnormality.

Adrenals/Urinary Tract: Adrenal glands are unremarkable. Kidneys are
normal, without renal calculi, focal lesion, or hydronephrosis.
Bladder is unremarkable.

Stomach/Bowel: Oral contrast opacifies multiple loops of small
bowel. There is mild thickened appearance of the distal jejunal
loops. Nondilated fluid-filled loops of small bowel in the lower
abdomen noted. Findings may be physiologic or represent mild
enteritis. Clinical correlation is recommended. There is an apparent
area of abutment and tethering of the adjacent small bowel loops
within the abdomen (series 2, image 50 and coronal image 26) which
may represent underlying adhesions. No evidence of bowel
obstruction. Moderate stool noted throughout the colon. Normal
appendix.

Vascular/Lymphatic: No significant vascular findings are present. No
enlarged abdominal or pelvic lymph nodes.

Reproductive: There is a subcentimeter right ovarian corpus luteum.
The left ovary and uterus appear unremarkable.

Other: None

Musculoskeletal: No acute or significant osseous findings.
IMPRESSION: Mild thickened appearance of the distal jejunal folds and
nondistended fluid-filled ileal loops may be physiologic or
represent mild enteritis. Clinical correlation is recommended. No
evidence of bowel obstruction. Normal appendix.

## 2018-04-08 ENCOUNTER — Encounter (HOSPITAL_COMMUNITY): Payer: Self-pay | Admitting: Emergency Medicine

## 2018-04-08 ENCOUNTER — Emergency Department (HOSPITAL_COMMUNITY)
Admission: EM | Admit: 2018-04-08 | Discharge: 2018-04-08 | Disposition: A | Discharge status: Home / Self Care | Payer: Self-pay | Attending: Emergency Medicine | Admitting: Emergency Medicine

## 2018-04-08 ENCOUNTER — Other Ambulatory Visit: Payer: Self-pay

## 2018-04-08 DIAGNOSIS — B349 Viral infection, unspecified: Secondary | ICD-10-CM | POA: Insufficient documentation

## 2018-04-08 DIAGNOSIS — F17210 Nicotine dependence, cigarettes, uncomplicated: Secondary | ICD-10-CM | POA: Insufficient documentation

## 2018-04-08 NOTE — Discharge Instructions (Addendum)
Please see your primary physician or return to the emergency department if any changes in your condition. Please wash your hands frequently.  Please have everyone in your home wash hands frequently.  Use Tylenol every 4 hours or ibuprofen every 6 hours for fever or aching.

## 2018-04-08 NOTE — ED Provider Notes (Signed)
[facility] EMERGENCY DEPARTMENT Provider Note   CSN: [id number] Arrival date & time: 04/08/18  1114     History   Chief Complaint Chief Complaint  Patient presents with  . Fever    HPI Chloe Hopkins is a 23 y.o. female.  The history is provided by the patient.  Fever  Temp source:  Oral Duration:  2 days Timing:  Intermittent Progression:  Worsening Chronicity:  New Worsened by:  Nothing Ineffective treatments:  Acetaminophen Associated symptoms: chills, congestion and cough   Associated symptoms: no chest pain, no confusion, no dysuria, no rash, no sore throat and no vomiting   Associated symptoms comment:  Sweating Risk factors: sick contacts     Past Medical History:  Diagnosis Date  . ADHD (attention deficit hyperactivity disorder)   . Anxiety   . Asthma   . Bipolar disorder (HCC)   . Depression   . History of fainting spells of unknown cause   . Migraine headache   . Seizures [facility])     Patient Active Problem List   Diagnosis Date Noted  . Self-inflicted laceration of wrist 08/22/2017  . Bipolar disorder (HCC) 08/22/2017  . Adjustment disorder 08/22/2017    Past Surgical History:  Procedure Laterality Date  . CHOLECYSTECTOMY  2012  . TONSILLECTOMY AND ADENOIDECTOMY       OB History   No obstetric history on file.      Home Medications    Prior to Admission medications   Medication Sig Start Date End Date Taking? Authorizing Provider  ARIPiprazole (ABILIFY) 5 MG tablet Take 1 tablet (5 mg total) by mouth daily. 08/22/17   Clapacs, Jackquline Denmark, MD  diazepam (VALIUM) 5 MG tablet Take 1 tablet (5 mg total) by mouth every 8 (eight) hours as needed for muscle spasms. 08/06/17   Emily Filbert, MD  ibuprofen (ADVIL,MOTRIN) 800 MG tablet Take 1 tablet (800 mg total) by mouth every 8 (eight) hours as needed. Patient not taking: Reported on 08/06/2017 04/18/17   Faythe Ghee, PA-C  ibuprofen (ADVIL,MOTRIN) 800 MG tablet Take 1 tablet (800 mg total)  by mouth every 8 (eight) hours as needed. 08/06/17   Emily Filbert, MD    Family History Family History  Problem Relation Age of Onset  . Diabetes Mother   . Bipolar disorder Father     Social History Social History   Tobacco Use  . Smoking status: Current Every Day Smoker    Packs/day: 1.00    Types: Cigarettes  . Smokeless tobacco: Never Used  Substance Use Topics  . Alcohol use: Yes    Comment: "barely"  . Drug use: No     Allergies   Coconut oil   Review of Systems Review of Systems  Constitutional: Positive for chills and fever. Negative for activity change.       All ROS Neg except as noted in HPI  HENT: Positive for congestion. Negative for nosebleeds and sore throat.   Eyes: Negative for photophobia and discharge.  Respiratory: Positive for cough. Negative for shortness of breath and wheezing.   Cardiovascular: Negative for chest pain and palpitations.  Gastrointestinal: Negative for abdominal pain, blood in stool and vomiting.  Genitourinary: Negative for dysuria, frequency and hematuria.  Musculoskeletal: Negative for arthralgias, back pain and neck pain.  Skin: Negative.  Negative for rash.  Neurological: Negative for dizziness, seizures and speech difficulty.  Psychiatric/Behavioral: Negative for confusion and hallucinations.     Physical Exam Updated Vital Signs BP 122/82 (  BP Location: Right Arm)   Pulse (!) 104   Temp 97.7 F (36.5 C) (Oral)   Resp 17   Ht 5' (1.524 m)   Wt 75.9 kg   LMP 04/08/2018   SpO2 97%   BMI 32.69 kg/m   Physical Exam Vitals signs and nursing note reviewed.  Constitutional:      Appearance: She is well-developed. She is not toxic-appearing.  HENT:     Head: Normocephalic.     Right Ear: Tympanic membrane and external ear normal.     Left Ear: Tympanic membrane and external ear normal.     Nose: Congestion present.  Eyes:     General: Lids are normal.     Pupils: Pupils are equal, round, and reactive to  light.  Neck:     Musculoskeletal: Normal range of motion and neck supple.     Vascular: No carotid bruit.  Cardiovascular:     Rate and Rhythm: Normal rate and regular rhythm.     Pulses: Normal pulses.     Heart sounds: Normal heart sounds.  Pulmonary:     Effort: No respiratory distress.     Breath sounds: Rhonchi present.  Abdominal:     General: Bowel sounds are normal.     Palpations: Abdomen is soft.     Tenderness: There is no abdominal tenderness. There is no guarding.  Musculoskeletal: Normal range of motion.  Lymphadenopathy:     Head:     Right side of head: No submandibular adenopathy.     Left side of head: No submandibular adenopathy.     Cervical: No cervical adenopathy.  Skin:    General: Skin is warm and dry.     Findings: No rash.  Neurological:     Mental Status: She is alert and oriented to person, place, and time.     Cranial Nerves: No cranial nerve deficit.     Sensory: No sensory deficit.  Psychiatric:        Speech: Speech normal.      ED Treatments / Results  Labs (all labs ordered are listed, but only abnormal results are displayed) Labs Reviewed - No data to display  EKG None  Radiology No results found.  Procedures Procedures (including critical care time)  Medications Ordered in ED Medications - No data to display   Initial Impression / Assessment and Plan / ED Course  I have reviewed the triage vital signs and the nursing notes.  Pertinent labs & imaging results that were available during my care of the patient were reviewed by me and considered in my medical decision making (see chart for details).       Final Clinical Impressions(s) / ED Diagnoses MDM  Vital signs reviewed.  Pulse oximetry is 97% on room air.  Within normal limits by my interpretation.  No hemoptysis reported.  No unusual rash.  No diarrhea or vomiting noted.  Patient has been having bouts with a temperature elevation, and sweats.  Examination is  consistent with an viral upper respiratory infection   Final diagnoses:  Viral illness    ED Discharge Orders    None       Ivery Quale, PA-C 04/09/18 2112    Pricilla Loveless, MD 04/10/18 [phone]

## 2018-04-08 NOTE — ED Triage Notes (Signed)
Fever since Monday.  States it was 100 Monday night.  Reports yesterday she couldn't stop sweating.  Only other s/s is cough in the mornings.

## 2018-04-12 ENCOUNTER — Emergency Department (HOSPITAL_COMMUNITY): Payer: Self-pay

## 2018-04-12 ENCOUNTER — Emergency Department (HOSPITAL_COMMUNITY)
Admission: EM | Admit: 2018-04-12 | Discharge: 2018-04-12 | Disposition: A | Discharge status: Home / Self Care | Payer: Self-pay | Attending: Emergency Medicine | Admitting: Emergency Medicine

## 2018-04-12 ENCOUNTER — Encounter (HOSPITAL_COMMUNITY): Payer: Self-pay | Admitting: Emergency Medicine

## 2018-04-12 ENCOUNTER — Other Ambulatory Visit: Payer: Self-pay

## 2018-04-12 DIAGNOSIS — S7001XA Contusion of right hip, initial encounter: Secondary | ICD-10-CM | POA: Insufficient documentation

## 2018-04-12 DIAGNOSIS — G8911 Acute pain due to trauma: Secondary | ICD-10-CM

## 2018-04-12 DIAGNOSIS — F17210 Nicotine dependence, cigarettes, uncomplicated: Secondary | ICD-10-CM | POA: Insufficient documentation

## 2018-04-12 DIAGNOSIS — J45909 Unspecified asthma, uncomplicated: Secondary | ICD-10-CM | POA: Insufficient documentation

## 2018-04-12 DIAGNOSIS — Y9352 Activity, horseback riding: Secondary | ICD-10-CM | POA: Insufficient documentation

## 2018-04-12 DIAGNOSIS — R200 Anesthesia of skin: Secondary | ICD-10-CM | POA: Insufficient documentation

## 2018-04-12 DIAGNOSIS — Y929 Unspecified place or not applicable: Secondary | ICD-10-CM | POA: Insufficient documentation

## 2018-04-12 DIAGNOSIS — Y999 Unspecified external cause status: Secondary | ICD-10-CM | POA: Insufficient documentation

## 2018-04-12 LAB — CBC WITH DIFFERENTIAL/PLATELET
Abs Immature Granulocytes: 0.03 10*3/uL (ref 0.00–0.07)
BASOS ABS: 0 10*3/uL (ref 0.0–0.1)
Basophils Relative: 0 %
EOS ABS: 0.1 10*3/uL (ref 0.0–0.5)
EOS PCT: 1 %
HCT: 47.3 % — ABNORMAL HIGH (ref 36.0–46.0)
HEMOGLOBIN: 15.5 g/dL — AB (ref 12.0–15.0)
Immature Granulocytes: 0 %
LYMPHS PCT: 31 %
Lymphs Abs: 3.7 10*3/uL (ref 0.7–4.0)
MCH: 29.8 pg (ref 26.0–34.0)
MCHC: 32.8 g/dL (ref 30.0–36.0)
MCV: 90.8 fL (ref 80.0–100.0)
Monocytes Absolute: 0.7 10*3/uL (ref 0.1–1.0)
Monocytes Relative: 6 %
NRBC: 0 % (ref 0.0–0.2)
Neutro Abs: 7.4 10*3/uL (ref 1.7–7.7)
Neutrophils Relative %: 62 %
Platelets: 271 10*3/uL (ref 150–400)
RBC: 5.21 MIL/uL — AB (ref 3.87–5.11)
RDW: 12.6 % (ref 11.5–15.5)
WBC: 11.9 10*3/uL — AB (ref 4.0–10.5)

## 2018-04-12 LAB — COMPREHENSIVE METABOLIC PANEL
ALBUMIN: 4.5 g/dL (ref 3.5–5.0)
ALT: 20 U/L (ref 0–44)
ANION GAP: 10 (ref 5–15)
AST: 22 U/L (ref 15–41)
Alkaline Phosphatase: 60 U/L (ref 38–126)
BUN: 13 mg/dL (ref 6–20)
CALCIUM: 9.2 mg/dL (ref 8.9–10.3)
CO2: 24 mmol/L (ref 22–32)
Chloride: 103 mmol/L (ref 98–111)
Creatinine, Ser: 0.73 mg/dL (ref 0.44–1.00)
GFR calc Af Amer: >60 mL/min (ref >60)
GFR calc non Af Amer: >60 mL/min (ref >60)
Glucose, Bld: 95 mg/dL (ref 70–99)
POTASSIUM: 3.7 mmol/L (ref 3.5–5.1)
SODIUM: 137 mmol/L (ref 135–145)
TOTAL PROTEIN: 7.1 g/dL (ref 6.5–8.1)
Total Bilirubin: 0.5 mg/dL (ref 0.3–1.2)

## 2018-04-12 LAB — I-STAT BETA HCG BLOOD, ED (MC, WL, AP ONLY): I-stat hCG, quantitative: <5 m[IU]/mL (ref <5)

## 2018-04-12 LAB — ETHANOL

## 2018-04-12 MED ORDER — FENTANYL CITRATE (PF) 100 MCG/2ML IJ SOLN
100.0000 ug | Freq: Once | INTRAMUSCULAR | Status: AC
  Administered 2018-04-12: 100 ug via INTRAVENOUS
  Filled 2018-04-12: qty 2

## 2018-04-12 MED ORDER — ONDANSETRON 4 MG PO TBDP
4.0000 mg | ORAL_TABLET | Freq: Once | ORAL | Status: AC
  Administered 2018-04-12: 4 mg via ORAL
  Filled 2018-04-12: qty 1

## 2018-04-12 MED ORDER — IOPAMIDOL (ISOVUE-300) INJECTION 61%
100.0000 mL | Freq: Once | INTRAVENOUS | Status: AC | PRN
  Administered 2018-04-12: 100 mL via INTRAVENOUS

## 2018-04-12 MED ORDER — KETOROLAC TROMETHAMINE 30 MG/ML IJ SOLN
30.0000 mg | Freq: Once | INTRAMUSCULAR | Status: AC
  Administered 2018-04-12: 30 mg via INTRAVENOUS
  Filled 2018-04-12: qty 1

## 2018-04-12 MED ORDER — NAPROXEN 500 MG PO TABS: 500.0000 mg | ORAL_TABLET | Freq: Two times a day (BID) | ORAL | 0 refills | Status: DC

## 2018-04-12 NOTE — ED Provider Notes (Signed)
[facility] EMERGENCY DEPARTMENT Provider Note   CSN: [id number] Arrival date & time: 04/12/18  1700     History   Chief Complaint Chief Complaint  Patient presents with  . Fall    off horse    HPI Chloe Hopkins is a 23 y.o. female.  HPI  23 year old female who presents after injuring the right side of her hip and pelvis and abdomen when she fell off a horse which occurred just prior to arrival.  She states that the horse was settled, she was sitting in the saddle with her feet in the stirrups and as she tried to get the horse to slow down from a galloping run her foot slipped out and she fell off onto her right side striking her right hip and side.  She states that her right leg is feeling numb and she has any pain with moving it at all.  She denies any nausea vomiting, fevers chills, chest pain coughing or shortness of breath.  She did not hit her head and has no neck pain and denies any back pain.  Her friends were able to get her into the car and transported to the hospital.  The patient denies any pain medicine prior to arrival.  Past Medical History:  Diagnosis Date  . ADHD (attention deficit hyperactivity disorder)   . Anxiety   . Asthma   . Bipolar disorder (HCC)   . Depression   . History of fainting spells of unknown cause   . Migraine headache   . Seizures [facility](HCC)     Patient Active Problem List   Diagnosis Date Noted  . Self-inflicted laceration of wrist 08/22/2017  . Bipolar disorder (HCC) 08/22/2017  . Adjustment disorder 08/22/2017    Past Surgical History:  Procedure Laterality Date  . CHOLECYSTECTOMY  2012  . TONSILLECTOMY AND ADENOIDECTOMY       OB History   No obstetric history on file.      Home Medications    Prior to Admission medications   Medication Sig Start Date End Date Taking? Authorizing Provider  ARIPiprazole (ABILIFY) 5 MG tablet Take 1 tablet (5 mg total) by mouth daily. 08/22/17   Clapacs, Jackquline DenmarkJohn T, MD  diazepam (VALIUM) 5 MG  tablet Take 1 tablet (5 mg total) by mouth every 8 (eight) hours as needed for muscle spasms. 08/06/17   Emily FilbertWilliams, Jonathan E, MD  ibuprofen (ADVIL,MOTRIN) 800 MG tablet Take 1 tablet (800 mg total) by mouth every 8 (eight) hours as needed. Patient not taking: Reported on 08/06/2017 04/18/17   Faythe GheeFisher, Susan W, PA-C  ibuprofen (ADVIL,MOTRIN) 800 MG tablet Take 1 tablet (800 mg total) by mouth every 8 (eight) hours as needed. 08/06/17   Emily FilbertWilliams, Jonathan E, MD    Family History Family History  Problem Relation Age of Onset  . Diabetes Mother   . Bipolar disorder Father     Social History Social History   Tobacco Use  . Smoking status: Current Every Day Smoker    Packs/day: 1.00    Types: Cigarettes  . Smokeless tobacco: Never Used  Substance Use Topics  . Alcohol use: Yes    Comment: "barely"  . Drug use: Yes    Types: Marijuana     Allergies   Coconut oil   Review of Systems Review of Systems  All other systems reviewed and are negative.    Physical Exam Updated Vital Signs BP (!) 155/84   Pulse (!) 106   Temp 97.7 F (36.5 C) (  Oral)   Resp 20   LMP 04/08/2018   SpO2 100%   Physical Exam Vitals signs and nursing note reviewed.  Constitutional:      General: She is not in acute distress.    Appearance: She is well-developed.  HENT:     Head: Normocephalic and atraumatic.     Mouth/Throat:     Pharynx: No oropharyngeal exudate.  Eyes:     General: No scleral icterus.       Right eye: No discharge.        Left eye: No discharge.     Conjunctiva/sclera: Conjunctivae normal.     Pupils: Pupils are equal, round, and reactive to light.  Neck:     Musculoskeletal: Normal range of motion and neck supple.     Thyroid: No thyromegaly.     Vascular: No JVD.  Cardiovascular:     Rate and Rhythm: Normal rate and regular rhythm.     Heart sounds: Normal heart sounds. No murmur. No friction rub. No gallop.   Pulmonary:     Effort: Pulmonary effort is normal. No  respiratory distress.     Breath sounds: Normal breath sounds. No wheezing or rales.  Abdominal:     General: Bowel sounds are normal. There is no distension.     Palpations: Abdomen is soft. There is no mass.     Tenderness: There is abdominal tenderness.     Comments: There is tenderness to palpation over the right hip and iliac crest as well as the right lower and lateral mid abdomen.  No tenderness over the ribs, no left-sided abdominal tenderness  Musculoskeletal:        General: Tenderness and signs of injury present. No swelling or deformity.     Right lower leg: No edema.     Left lower leg: No edema.     Comments: There is some abrasion and contusion sites around the right greater trochanter and iliac crest, tenderness in that area.  There is no leg length discrepancies and the patient is able to straight leg raise though with significant pain.  Lymphadenopathy:     Cervical: No cervical adenopathy.  Skin:    General: Skin is warm and dry.     Findings: No erythema or rash.  Neurological:     Mental Status: She is alert.     Coordination: Coordination normal.     Comments: The patient has apparent normal strength and sensation of the bilateral lower extremities.  Of her right lower extremity she is able to dorsiflex and plantarflex at the ankle with normal strength, she is able to flex and extend at the knee with normal strength.  She has difficulty flexing at the hip secondary to pain  Psychiatric:        Behavior: Behavior normal.      ED Treatments / Results  Labs (all labs ordered are listed, but only abnormal results are displayed) Labs Reviewed  CBC WITH DIFFERENTIAL/PLATELET  COMPREHENSIVE METABOLIC PANEL  ETHANOL  I-STAT BETA HCG BLOOD, ED ([facility], [facility], [facility] ONLY)    EKG None  Radiology No results found.  Procedures Procedures (including critical care time)  Medications Ordered in ED Medications  fentaNYL (SUBLIMAZE) injection 100 mcg (has no administration  in time range)     Initial Impression / Assessment and Plan / ED Course  I have reviewed the triage vital signs and the nursing notes.  Pertinent labs & imaging results that were available during my care of the  patient were reviewed by me and considered in my medical decision making (see chart for details).  Clinical Course as of Apr 12 1946  Wynelle Link Apr 12, 2018  1945 Improved with meds - ambulatory with some difficulty - no bony or internal injuries on CT - labs don't show any sig abnormalities - VS without tachycarida on d/c.   [BM]    Clinical Course User Index [BM] Eber Hong, MD    The patient has no signs of head injury, she has what appears to be contusions and abrasions down the right side, will obtain imaging to rule out pelvic fracture or hip fracture or intra-abdominal injuries.  No other signs of trauma  Final Clinical Impressions(s) / ED Diagnoses   Final diagnoses:  Fall from horse, initial encounter  Contusion of right hip, initial encounter    ED Discharge Orders         Ordered    naproxen (NAPROSYN) 500 MG tablet  2 times daily with meals     04/12/18 1947           Eber Hong, MD 04/12/18 1948

## 2018-04-12 NOTE — ED Triage Notes (Addendum)
Pt fell off horse pta. Denies hitting head or neck pain. C/o falling and pain to right flank area with numbness down right leg and cant feel right foot at all. Tries to move foot and has some movement. Pt received out of vehicle onto stretcher by edp and RN. Pt tearful.

## 2018-04-12 NOTE — Discharge Instructions (Signed)
Your xrays show on internal or bony injuries See your doctor as needed Naprosyn for pain. Ice and rest for 24 hours.

## 2018-04-12 NOTE — ED Notes (Signed)
Pt ambulatory to waiting room. Pt verbalized understanding of discharge instructions.   

## 2018-08-13 ENCOUNTER — Other Ambulatory Visit: Payer: Self-pay

## 2018-08-13 ENCOUNTER — Encounter (HOSPITAL_COMMUNITY): Payer: Self-pay

## 2018-08-13 ENCOUNTER — Emergency Department (HOSPITAL_COMMUNITY)
Admission: EM | Admit: 2018-08-13 | Discharge: 2018-08-13 | Disposition: A | Discharge status: Home / Self Care | Payer: Self-pay | Attending: Emergency Medicine | Admitting: Emergency Medicine

## 2018-08-13 DIAGNOSIS — Z5321 Procedure and treatment not carried out due to patient leaving prior to being seen by health care provider: Secondary | ICD-10-CM | POA: Insufficient documentation

## 2018-08-13 DIAGNOSIS — G43909 Migraine, unspecified, not intractable, without status migrainosus: Secondary | ICD-10-CM | POA: Insufficient documentation

## 2018-08-13 NOTE — ED Triage Notes (Signed)
Pt presents to ED with complaints of migraine since 1400. Pt states she has been nauseated from the migraine and vomited x 2. Pt has took tylenol 1000 mg.

## 2018-12-18 ENCOUNTER — Emergency Department (HOSPITAL_COMMUNITY): Payer: Self-pay

## 2018-12-18 ENCOUNTER — Other Ambulatory Visit: Payer: Self-pay

## 2018-12-18 ENCOUNTER — Emergency Department (HOSPITAL_COMMUNITY)
Admission: EM | Admit: 2018-12-18 | Discharge: 2018-12-18 | Disposition: A | Discharge status: Home / Self Care | Payer: Self-pay | Attending: Emergency Medicine | Admitting: Emergency Medicine

## 2018-12-18 ENCOUNTER — Encounter (HOSPITAL_COMMUNITY): Payer: Self-pay | Admitting: *Deleted

## 2018-12-18 DIAGNOSIS — Z79899 Other long term (current) drug therapy: Secondary | ICD-10-CM | POA: Insufficient documentation

## 2018-12-18 DIAGNOSIS — J45909 Unspecified asthma, uncomplicated: Secondary | ICD-10-CM | POA: Insufficient documentation

## 2018-12-18 DIAGNOSIS — Z87891 Personal history of nicotine dependence: Secondary | ICD-10-CM | POA: Insufficient documentation

## 2018-12-18 DIAGNOSIS — Z91040 Latex allergy status: Secondary | ICD-10-CM | POA: Insufficient documentation

## 2018-12-18 DIAGNOSIS — R112 Nausea with vomiting, unspecified: Secondary | ICD-10-CM | POA: Insufficient documentation

## 2018-12-18 DIAGNOSIS — R1031 Right lower quadrant pain: Secondary | ICD-10-CM | POA: Insufficient documentation

## 2018-12-18 LAB — LIPASE, BLOOD: Lipase: 19 U/L (ref 11–51)

## 2018-12-18 LAB — COMPREHENSIVE METABOLIC PANEL
ALT: 35 U/L (ref 0–44)
AST: 28 U/L (ref 15–41)
Albumin: 4.2 g/dL (ref 3.5–5.0)
Alkaline Phosphatase: 76 U/L (ref 38–126)
Anion gap: 8 (ref 5–15)
BUN: 11 mg/dL (ref 6–20)
CO2: 26 mmol/L (ref 22–32)
Calcium: 9.2 mg/dL (ref 8.9–10.3)
Chloride: 106 mmol/L (ref 98–111)
Creatinine, Ser: 0.68 mg/dL (ref 0.44–1.00)
GFR calc Af Amer: >60 mL/min (ref >60)
GFR calc non Af Amer: >60 mL/min (ref >60)
Glucose, Bld: 91 mg/dL (ref 70–99)
Potassium: 4.5 mmol/L (ref 3.5–5.1)
Sodium: 140 mmol/L (ref 135–145)
Total Bilirubin: 0.7 mg/dL (ref 0.3–1.2)
Total Protein: 7.2 g/dL (ref 6.5–8.1)

## 2018-12-18 LAB — URINALYSIS, ROUTINE W REFLEX MICROSCOPIC
Bacteria, UA: NONE SEEN
Bilirubin Urine: NEGATIVE
Glucose, UA: NEGATIVE mg/dL
Ketones, ur: NEGATIVE mg/dL
Leukocytes,Ua: NEGATIVE
Nitrite: NEGATIVE
Protein, ur: NEGATIVE mg/dL
Specific Gravity, Urine: 1.017 (ref 1.005–1.030)
pH: 6 (ref 5.0–8.0)

## 2018-12-18 LAB — CBC
HCT: 47.5 % — ABNORMAL HIGH (ref 36.0–46.0)
Hemoglobin: 15.5 g/dL — ABNORMAL HIGH (ref 12.0–15.0)
MCH: 30 pg (ref 26.0–34.0)
MCHC: 32.6 g/dL (ref 30.0–36.0)
MCV: 92.1 fL (ref 80.0–100.0)
Platelets: 249 10*3/uL (ref 150–400)
RBC: 5.16 MIL/uL — ABNORMAL HIGH (ref 3.87–5.11)
RDW: 12.6 % (ref 11.5–15.5)
WBC: 9.9 10*3/uL (ref 4.0–10.5)
nRBC: 0 % (ref 0.0–0.2)

## 2018-12-18 LAB — POC URINE PREG, ED: Preg Test, Ur: NEGATIVE

## 2018-12-18 MED ORDER — SODIUM CHLORIDE 0.9 % IV BOLUS
1000.0000 mL | Freq: Once | INTRAVENOUS | Status: AC
  Administered 2018-12-18: 1000 mL via INTRAVENOUS

## 2018-12-18 MED ORDER — ONDANSETRON 4 MG PO TBDP: 4.0000 mg | ORAL_TABLET | Freq: Three times a day (TID) | ORAL | 0 refills | Status: DC | PRN

## 2018-12-18 MED ORDER — ONDANSETRON 4 MG PO TBDP
4.0000 mg | ORAL_TABLET | Freq: Once | ORAL | Status: AC | PRN
  Administered 2018-12-18: 10:00:00 4 mg via ORAL
  Filled 2018-12-18: qty 1

## 2018-12-18 MED ORDER — IOHEXOL 300 MG/ML  SOLN
100.0000 mL | Freq: Once | INTRAMUSCULAR | Status: AC | PRN
  Administered 2018-12-18: 100 mL via INTRAVENOUS

## 2018-12-18 NOTE — ED Provider Notes (Signed)
[facility] EMERGENCY DEPARTMENT Provider Note   CSN: [id number] Arrival date & time: 12/18/18  [id number]     History   Chief Complaint Chief Complaint  Patient presents with  . Emesis    HPI Chloe Hopkins is a 23 y.o. female.     HPI   Chloe Hopkins is a 23 y.o. female who presents to the Emergency Department complaining of upper abdominal pain, nausea, and vomiting.  Symptoms have been present for 4 days.  She states that she works in [employer], but does not think this is food related.  She reports multiple episodes of vomiting and has been unable to keep down any food or fluids.  She does tolerate some ice chips.  She describes an aching pain to her upper and right abdomen that is associated with vomiting.  Pain does not radiate.  She also reports intermittent frontal headache that she associates with persistent vomiting.  No neck pain, visual changes or neck stiffness.  She denies any fever, dysuria, flank pain, cough or known COVID exposures.  Nothing makes her pain better or worse.   Past Medical History:  Diagnosis Date  . ADHD (attention deficit hyperactivity disorder)   . Anxiety   . Asthma   . Bipolar disorder (HCC)   . Depression   . History of fainting spells of unknown cause   . Migraine headache   . Seizures (HCC)    last one 2-3 years ago as of 12/18/18    Patient Active Problem List   Diagnosis Date Noted  . Self-inflicted laceration of wrist 08/22/2017  . Bipolar disorder (HCC) 08/22/2017  . Adjustment disorder 08/22/2017    Past Surgical History:  Procedure Laterality Date  . CHOLECYSTECTOMY  2012  . TONSILLECTOMY AND ADENOIDECTOMY       OB History   No obstetric history on file.      Home Medications    Prior to Admission medications   Medication Sig Start Date End Date Taking? Authorizing Provider  ARIPiprazole (ABILIFY) 5 MG tablet Take 1 tablet (5 mg total) by mouth daily. 08/22/17   Clapacs, Jackquline DenmarkJohn T, MD  naproxen (NAPROSYN) 500 MG  tablet Take 1 tablet (500 mg total) by mouth 2 (two) times daily with a meal. 04/12/18   Eber HongMiller, Brian, MD    Family History Family History  Problem Relation Age of Onset  . Diabetes Mother   . Bipolar disorder Father     Social History Social History   Tobacco Use  . Smoking status: Former Smoker    Packs/day: 1.00    Types: Cigarettes  . Smokeless tobacco: Never Used  Substance Use Topics  . Alcohol use: Not Currently  . Drug use: Yes    Types: Marijuana     Allergies   Coconut oil and Latex   Review of Systems Review of Systems  Constitutional: Negative for appetite change, chills and fever.  Respiratory: Negative for shortness of breath.   Cardiovascular: Negative for chest pain.  Gastrointestinal: Positive for abdominal pain, nausea and vomiting. Negative for blood in stool and diarrhea.  Genitourinary: Negative for decreased urine volume, difficulty urinating, dysuria and flank pain.  Musculoskeletal: Negative for back pain.  Skin: Negative for color change and rash.  Neurological: Positive for headaches. Negative for dizziness, syncope, weakness and numbness.  Hematological: Negative for adenopathy.     Physical Exam Updated Vital Signs BP 120/74 (BP Location: Right Arm)   Pulse 94   Temp 98.1 F (36.7 C) (Oral)  Resp 16   Ht 5\' 1"  (1.549 m)   Wt 71.7 kg   LMP 12/01/2018   SpO2 98%   BMI 29.85 kg/m   Physical Exam Vitals signs and nursing note reviewed.  Constitutional:      Appearance: Normal appearance. She is not ill-appearing or toxic-appearing.  HENT:     Mouth/Throat:     Mouth: Mucous membranes are moist.     Pharynx: No oropharyngeal exudate or posterior oropharyngeal erythema.  Neck:     Musculoskeletal: Normal range of motion.  Cardiovascular:     Rate and Rhythm: Normal rate and regular rhythm.     Pulses: Normal pulses.  Pulmonary:     Effort: Pulmonary effort is normal.     Breath sounds: Normal breath sounds.  Chest:      Chest wall: No tenderness.  Abdominal:     Palpations: Abdomen is soft. There is no mass.     Tenderness: There is abdominal tenderness in the right lower quadrant and epigastric area. There is no right CVA tenderness, left CVA tenderness, guarding or rebound.     Comments: Tenderness to palpation of the epigastric and right lower quadrant regions.  Abdomen is soft.  No guarding or rebound tenderness.  No CVA tenderness  Musculoskeletal: Normal range of motion.  Skin:    General: Skin is warm.     Capillary Refill: Capillary refill takes less than 2 seconds.     Findings: No erythema or rash.  Neurological:     General: No focal deficit present.     Mental Status: She is alert.      ED Treatments / Results  Labs (all labs ordered are listed, but only abnormal results are displayed) Labs Reviewed  CBC - Abnormal; Notable for the following components:      Result Value   RBC 5.16 (*)    Hemoglobin 15.5 (*)    HCT 47.5 (*)    All other components within normal limits  URINALYSIS, ROUTINE W REFLEX MICROSCOPIC - Abnormal; Notable for the following components:   APPearance HAZY (*)    Hgb urine dipstick SMALL (*)    All other components within normal limits  LIPASE, BLOOD  COMPREHENSIVE METABOLIC PANEL  POC URINE PREG, ED    EKG None  Radiology Ct Abdomen Pelvis W Contrast  Result Date: 12/18/2018 CLINICAL DATA:  Abdominal pain with nausea and vomiting EXAM: CT ABDOMEN AND PELVIS WITH CONTRAST TECHNIQUE: Multidetector CT imaging of the abdomen and pelvis was performed using the standard protocol following bolus administration of intravenous contrast. CONTRAST:  166mL OMNIPAQUE IOHEXOL 300 MG/ML  SOLN COMPARISON:  April 12, 2018 FINDINGS: Lower chest: Lung bases are clear. Hepatobiliary: No focal liver lesions are demonstrable on this study. Gallbladder is absent. There is no appreciable biliary duct dilatation. Pancreas: No pancreatic mass or inflammatory focus. Spleen: No  splenic lesions evident. Adrenals/Urinary Tract: Adrenals bilaterally appear normal. Kidneys bilaterally show no evident mass or hydronephrosis on either side. There is no evident intrarenal or ureteral calculus on either side. Urinary bladder is midline with wall thickness within normal limits. Stomach/Bowel: There is no appreciable bowel wall or mesenteric thickening. There is moderate stool in the colon. Terminal ileum appears normal. There is no evident bowel obstruction. There is no free air or portal venous air. Vascular/Lymphatic: No abdominal aortic aneurysm. No vascular lesions evident. There is no adenopathy in the abdomen or pelvis. Reproductive: Uterus is midline. There is evidence of a collapsed cyst within the left  ovary measuring 1.4 x 1.3 cm. No other pelvic masses evident. There is no appreciable fluid in the dependent portion of the pelvis. Other: Appendix appears unremarkable. No abscess or ascites is evident in the abdomen or pelvis. Musculoskeletal: There are no blastic or lytic bone lesions. There is no intramuscular or abdominal wall lesion. IMPRESSION: 1. Apparent collapsed cyst within the left ovary measuring 1.4 x 1.3 cm. No fluid is appreciable tracking from this area. 2. No evident bowel obstruction. No abscess in the abdomen or pelvis. Appendix appears unremarkable. 3. No evident renal or ureteral calculus. No hydronephrosis. Urinary bladder wall thickness within normal limits. 4.  Gallbladder absent. Electronically Signed   By: Bretta Bang III M.D.   On: 12/18/2018 13:55    Procedures Procedures (including critical care time)  Medications Ordered in ED Medications  sodium chloride 0.9 % bolus 1,000 mL (has no administration in time range)  ondansetron (ZOFRAN-ODT) disintegrating tablet 4 mg (4 mg Oral Given 12/18/18 1020)     Initial Impression / Assessment and Plan / ED Course  I have reviewed the triage vital signs and the nursing notes.  Pertinent labs & imaging  results that were available during my care of the patient were reviewed by me and considered in my medical decision making (see chart for details).       Work up today is reassuring.  She is well appearing.  Mild epigastric and RLQ tenderness on exam, no peritoneal signs.  Pt on her phone during my exam.  Will obtain labs and CT abd/pelvis  On recheck, pt sitting upright on the stretcher, tolerated fluid trial w/o difficulty. Vitals reviewed.  No vomiting during ER stay. CT abd/pelvis shows nml appearing appendix.  I feel pt is appropriate for d/c home.  Will tx symptomatically with anti-emetic, she agrees to clear fluids.  Return precautions discussed.     Final Clinical Impressions(s) / ED Diagnoses   Final diagnoses:  Right lower quadrant abdominal pain    ED Discharge Orders    None       Pauline Aus, PA-C 12/18/18 1440    Blane Ohara, MD 12/18/18 1606

## 2018-12-18 NOTE — Discharge Instructions (Signed)
Clear fluids today, then bland diet as tolerated.  Follow-up with your primary provider or return here for any worsening symptoms

## 2018-12-18 NOTE — ED Triage Notes (Signed)
Pt c/o vomiting, headache, abdominal pain x 4 days. Pt reports vomiting is worse than anything. Denies fever, new cough, loss of taste or smell, diarrhea.

## 2018-12-18 NOTE — ED Notes (Signed)
Pt upset cursing, RN to bedside. Informed of delay. Pt wanted IV out. RN removed IV and reassured pt.

## 2018-12-18 NOTE — ED Notes (Signed)
Pt tolerating oral fluids 

## 2019-03-02 ENCOUNTER — Other Ambulatory Visit: Payer: Self-pay

## 2019-03-02 DIAGNOSIS — Z20822 Contact with and (suspected) exposure to covid-19: Secondary | ICD-10-CM

## 2019-03-04 ENCOUNTER — Telehealth: Payer: Self-pay | Admitting: *Deleted

## 2019-03-04 LAB — NOVEL CORONAVIRUS, NAA: SARS-CoV-2, NAA: NOT DETECTED

## 2019-03-04 NOTE — Telephone Encounter (Signed)
Pt called to check the status of her covid-19 test result. Her result has not resulted yet. She voiced understanding.

## 2019-03-10 ENCOUNTER — Telehealth: Payer: Self-pay

## 2019-03-10 NOTE — Telephone Encounter (Signed)
Would like result faxed to [facility], Attn: tom at [phone]. Will send request to team lead

## 2019-04-14 ENCOUNTER — Ambulatory Visit
Admission: EM | Admit: 2019-04-14 | Discharge: 2019-04-14 | Disposition: A | Discharge status: Home / Self Care | Payer: Self-pay | Attending: Emergency Medicine | Admitting: Emergency Medicine

## 2019-04-14 ENCOUNTER — Other Ambulatory Visit: Payer: Self-pay

## 2019-04-14 DIAGNOSIS — N3001 Acute cystitis with hematuria: Secondary | ICD-10-CM

## 2019-04-14 DIAGNOSIS — Z20822 Contact with and (suspected) exposure to covid-19: Secondary | ICD-10-CM

## 2019-04-14 DIAGNOSIS — R112 Nausea with vomiting, unspecified: Secondary | ICD-10-CM

## 2019-04-14 LAB — POCT URINALYSIS DIP (MANUAL ENTRY)
Bilirubin, UA: NEGATIVE
Glucose, UA: NEGATIVE mg/dL
Ketones, POC UA: NEGATIVE mg/dL
Nitrite, UA: NEGATIVE
Protein Ur, POC: NEGATIVE mg/dL
Spec Grav, UA: 1.025 (ref 1.010–1.025)
Urobilinogen, UA: 0.2 E.U./dL
pH, UA: 7 (ref 5.0–8.0)

## 2019-04-14 LAB — POCT URINE PREGNANCY: Preg Test, Ur: NEGATIVE

## 2019-04-14 MED ORDER — NITROFURANTOIN MONOHYD MACRO 100 MG PO CAPS: 100.0000 mg | ORAL_CAPSULE | Freq: Two times a day (BID) | ORAL | 0 refills | Status: DC

## 2019-04-14 MED ORDER — ONDANSETRON HCL 4 MG PO TABS: 4.0000 mg | ORAL_TABLET | Freq: Three times a day (TID) | ORAL | 0 refills | Status: DC | PRN

## 2019-04-14 NOTE — Discharge Instructions (Addendum)
Point of care urine analysis show moderate amount of blood and trace of white blood cell Urine pregnancy was negative  COVID testing ordered.  It will take between 2-7 days for test results.  Someone will contact you regarding abnormal results.    In the meantime: You should remain isolated in your home for 10 days from symptom onset AND greater than 72 hours after symptoms resolution (absence of fever without the use of fever-reducing medication and improvement in respiratory symptoms), whichever is longer Get plenty of rest and push fluids Use medications daily for symptom relief Use OTC medications like ibuprofen or tylenol as needed fever or pain Call or go to the ED if you have any new or worsening symptoms such as fever, worsening cough, shortness of breath, chest tightness, chest pain, turning blue, changes in mental status, etc..Marland Kitchen

## 2019-04-14 NOTE — ED Provider Notes (Signed)
[facility]    CSN: [id number] Arrival date & time: 04/14/19  1044      History   Chief Complaint Chief Complaint  Patient presents with  . Nausea    HPI Chloe Hopkins is a 24 y.o. female.   Chloe Hopkins 24 years old female presented to the urgent care with a complaint of nausea and vomiting for the past month.  Reports she tested negative for COVID-19 on 03/02/2019.  Reported negative home pregnancy test twice.    She denies a precipitating event, or eating anything unusual.  States no one around her has similar symptoms.  She has vomiting 2 times.  Denies blood in her vomit.  Report noticeable of diarrhea.  She has not tried any medication.  Her symptom are made worst with eating.  She denies fever, chills, melena, hematochezia.     Past Medical History:  Diagnosis Date  . ADHD (attention deficit hyperactivity disorder)   . Anxiety   . Asthma   . Bipolar disorder (HCC)   . Depression   . History of fainting spells of unknown cause   . Migraine headache   . Seizures (HCC)    last one 2-3 years ago as of 12/18/18    Patient Active Problem List   Diagnosis Date Noted  . Self-inflicted laceration of wrist (HCC) 08/22/2017  . Bipolar disorder (HCC) 08/22/2017  . Adjustment disorder 08/22/2017    Past Surgical History:  Procedure Laterality Date  . CHOLECYSTECTOMY  2012  . TONSILLECTOMY AND ADENOIDECTOMY      OB History   No obstetric history on file.      Home Medications    Prior to Admission medications   Medication Sig Start Date End Date Taking? Authorizing Provider  ARIPiprazole (ABILIFY) 5 MG tablet Take 1 tablet (5 mg total) by mouth daily. 08/22/17   Clapacs, Jackquline Denmark, MD  naproxen (NAPROSYN) 500 MG tablet Take 1 tablet (500 mg total) by mouth 2 (two) times daily with a meal. 04/12/18   Eber Hong, MD  nitrofurantoin, macrocrystal-monohydrate, (MACROBID) 100 MG capsule Take 1 capsule (100 mg total) by mouth 2 (two) times daily. 04/14/19    Meric Joye, Zachery Dakins, FNP  ondansetron (ZOFRAN ODT) 4 MG disintegrating tablet Take 1 tablet (4 mg total) by mouth every 8 (eight) hours as needed for nausea or vomiting. 12/18/18   Triplett, Tammy, PA-C  ondansetron (ZOFRAN) 4 MG tablet Take 1 tablet (4 mg total) by mouth every 8 (eight) hours as needed for nausea or vomiting. 04/14/19   Jameriah Trotti, Zachery Dakins, FNP    Family History Family History  Problem Relation Age of Onset  . Diabetes Mother   . Bipolar disorder Father     Social History Social History   Tobacco Use  . Smoking status: Former Smoker    Packs/day: 1.00    Types: Cigarettes  . Smokeless tobacco: Never Used  Substance Use Topics  . Alcohol use: Not Currently  . Drug use: Yes    Types: Marijuana     Allergies   Coconut oil and Latex   Review of Systems Review of Systems  Constitutional: Negative.   Respiratory: Negative.   Cardiovascular: Negative.   Gastrointestinal: Positive for abdominal pain, nausea and vomiting.  All other systems reviewed and are negative.    Physical Exam Triage Vital Signs ED Triage Vitals  Enc Vitals Group     BP 04/14/19 1055 116/79     Pulse Rate 04/14/19 1055 (!) 110  Resp 04/14/19 1055 20     Temp 04/14/19 1055 98 F (36.7 C)     Temp src --      SpO2 04/14/19 1055 95 %     Weight --      Height --      Head Circumference --      Peak Flow --      Pain Score 04/14/19 1053 0     Pain Loc --      Pain Edu? --      Excl. in GC? --    No data found.  Updated Vital Signs BP 116/79   Pulse (!) 110   Temp 98 F (36.7 C)   Resp 20   LMP 03/03/2019 (Approximate)   SpO2 95%   Visual Acuity Right Eye Distance:   Left Eye Distance:   Bilateral Distance:    Right Eye Near:   Left Eye Near:    Bilateral Near:     Physical Exam Vitals and nursing note reviewed.  Constitutional:      General: She is not in acute distress.    Appearance: Normal appearance. She is normal weight. She is not ill-appearing or  toxic-appearing.  Cardiovascular:     Rate and Rhythm: Regular rhythm. Tachycardia present.     Pulses: Normal pulses.     Heart sounds: Normal heart sounds. No murmur.  Pulmonary:     Effort: Pulmonary effort is normal. No respiratory distress.     Breath sounds: Normal breath sounds. No stridor. No wheezing, rhonchi or rales.  Chest:     Chest wall: No tenderness.  Abdominal:     General: Abdomen is flat. Bowel sounds are normal. There is no distension.     Palpations: Abdomen is soft. There is no mass.     Tenderness: There is abdominal tenderness in the epigastric area. There is no right CVA tenderness, guarding or rebound.     Hernia: No hernia is present.     Comments: Epigastric tenderness on palpation.  Pain does not radiate to her back  Neurological:     Mental Status: She is alert.      UC Treatments / Results  Labs (all labs ordered are listed, but only abnormal results are displayed) Labs Reviewed  POCT URINALYSIS DIP (MANUAL ENTRY) - Abnormal; Notable for the following components:      Result Value   Blood, UA moderate (*)    Leukocytes, UA Trace (*)    All other components within normal limits  POCT URINE PREGNANCY    EKG   Radiology No results found.  Procedures Procedures (including critical care time)  Medications Ordered in UC Medications - No data to display  Initial Impression / Assessment and Plan / UC Course  I have reviewed the triage vital signs and the nursing notes.  Pertinent labs & imaging results that were available during my care of the patient were reviewed by me and considered in my medical decision making (see chart for details).   Point-of-care urinalysis test was ordered and result was reviewed.  Results showed moderate amount of red blood cell and trace of leukocyte Point-of-care urine pregnancy test was ordered and result was reviewed.  Result was negative for pregnancy We will treat the patient for possible UTI Nitrofurantoin  will be prescribed Zofran will be prescribed Advised for bland diet and increase diet as tolerated COVID-19 test was ordered Advised patient to quarantine To go to ED for worsening of symptoms  Final  Clinical Impressions(s) / UC Diagnoses   Final diagnoses:  Nausea and vomiting in adult  Acute cystitis with hematuria  COVID-19 ruled out     Discharge Instructions     Point of care urine analysis show moderate amount of blood and trace of white blood cell Urine pregnancy was negative  COVID testing ordered.  It will take between 2-7 days for test results.  Someone will contact you regarding abnormal results.    In the meantime: You should remain isolated in your home for 10 days from symptom onset AND greater than 72 hours after symptoms resolution (absence of fever without the use of fever-reducing medication and improvement in respiratory symptoms), whichever is longer Get plenty of rest and push fluids Use medications daily for symptom relief Use OTC medications like ibuprofen or tylenol as needed fever or pain Call or go to the ED if you have any new or worsening symptoms such as fever, worsening cough, shortness of breath, chest tightness, chest pain, turning blue, changes in mental status, etc...     ED Prescriptions    Medication Sig Dispense Auth. Provider   nitrofurantoin, macrocrystal-monohydrate, (MACROBID) 100 MG capsule Take 1 capsule (100 mg total) by mouth 2 (two) times daily. 10 capsule Stellar Gensel S, FNP   ondansetron (ZOFRAN) 4 MG tablet Take 1 tablet (4 mg total) by mouth every 8 (eight) hours as needed for nausea or vomiting. 20 tablet Adalie Mand, Darrelyn Hillock, FNP     PDMP not reviewed this encounter.   Emerson Monte, [place] 04/14/19 1137

## 2019-04-14 NOTE — ED Triage Notes (Signed)
Pt states she has had episodes of vomiting for past month, pt states last period was December 9th, she has taken home pregnancy test that was negative

## 2019-04-15 LAB — NOVEL CORONAVIRUS, NAA: SARS-CoV-2, NAA: NOT DETECTED

## 2019-04-16 LAB — URINE CULTURE

## 2020-01-04 ENCOUNTER — Encounter (HOSPITAL_COMMUNITY): Payer: Self-pay | Admitting: Emergency Medicine

## 2020-01-04 ENCOUNTER — Emergency Department (HOSPITAL_COMMUNITY)
Admission: EM | Admit: 2020-01-04 | Discharge: 2020-01-04 | Disposition: A | Discharge status: Home / Self Care | Payer: Self-pay | Attending: Emergency Medicine | Admitting: Emergency Medicine

## 2020-01-04 ENCOUNTER — Other Ambulatory Visit: Payer: Self-pay

## 2020-01-04 DIAGNOSIS — R112 Nausea with vomiting, unspecified: Secondary | ICD-10-CM | POA: Insufficient documentation

## 2020-01-04 DIAGNOSIS — Z87891 Personal history of nicotine dependence: Secondary | ICD-10-CM | POA: Insufficient documentation

## 2020-01-04 DIAGNOSIS — J45909 Unspecified asthma, uncomplicated: Secondary | ICD-10-CM | POA: Insufficient documentation

## 2020-01-04 DIAGNOSIS — Z91040 Latex allergy status: Secondary | ICD-10-CM | POA: Insufficient documentation

## 2020-01-04 LAB — BASIC METABOLIC PANEL
Anion gap: 12 (ref 5–15)
BUN: 12 mg/dL (ref 6–20)
CO2: 24 mmol/L (ref 22–32)
Calcium: 9.4 mg/dL (ref 8.9–10.3)
Chloride: 101 mmol/L (ref 98–111)
Creatinine, Ser: 0.77 mg/dL (ref 0.44–1.00)
GFR, Estimated: >60 mL/min (ref >60)
Glucose, Bld: 107 mg/dL — ABNORMAL HIGH (ref 70–99)
Potassium: 3.2 mmol/L — ABNORMAL LOW (ref 3.5–5.1)
Sodium: 137 mmol/L (ref 135–145)

## 2020-01-04 LAB — HCG, SERUM, QUALITATIVE: Preg, Serum: NEGATIVE

## 2020-01-04 MED ORDER — ONDANSETRON 4 MG PO TBDP: ORAL_TABLET | ORAL | 0 refills | Status: DC

## 2020-01-04 MED ORDER — PROMETHAZINE HCL 25 MG/ML IJ SOLN
25.0000 mg | Freq: Once | INTRAMUSCULAR | Status: AC
  Administered 2020-01-04: 25 mg via INTRAMUSCULAR
  Filled 2020-01-04: qty 1

## 2020-01-04 MED ORDER — PROMETHAZINE HCL 25 MG RE SUPP: 25.0000 mg | Freq: Four times a day (QID) | RECTAL | 0 refills | Status: DC | PRN

## 2020-01-04 NOTE — ED Provider Notes (Signed)
[facility] EMERGENCY DEPARTMENT Provider Note   CSN: [id number] Arrival date & time: 01/04/20  0243     History Chief Complaint  Patient presents with  . Emesis    Chloe Hopkins is a 24 y.o. female.  Apparently had subutex on Saturday and feels bad since then with nbnb emesis. No abdominal pain. No fevers. No sick contacts. No diarrhea.    Emesis Severity:  Moderate Duration:  4 days Timing:  Constant Quality:  Stomach contents Able to tolerate:  Liquids Progression:  Unchanged Chronicity:  Recurrent Recent urination:  Normal Relieved by:  None tried Ineffective treatments:  None tried      Past Medical History:  Diagnosis Date  . ADHD (attention deficit hyperactivity disorder)   . Anxiety   . Asthma   . Bipolar disorder (HCC)   . Depression   . History of fainting spells of unknown cause   . Migraine headache   . Seizures (HCC)    last one 2-3 years ago as of 12/18/18    Patient Active Problem List   Diagnosis Date Noted  . Self-inflicted laceration of wrist (HCC) 08/22/2017  . Bipolar disorder (HCC) 08/22/2017  . Adjustment disorder 08/22/2017    Past Surgical History:  Procedure Laterality Date  . CHOLECYSTECTOMY  2012  . TONSILLECTOMY AND ADENOIDECTOMY       OB History   No obstetric history on file.     Family History  Problem Relation Age of Onset  . Diabetes Mother   . Bipolar disorder Father     Social History   Tobacco Use  . Smoking status: Former Smoker    Packs/day: 1.00    Types: Cigarettes  . Smokeless tobacco: Never Used  Vaping Use  . Vaping Use: Every day  Substance Use Topics  . Alcohol use: Not Currently  . Drug use: Yes    Types: Marijuana    Home Medications Prior to Admission medications   Medication Sig Start Date End Date Taking? Authorizing Provider  ARIPiprazole (ABILIFY) 5 MG tablet Take 1 tablet (5 mg total) by mouth daily. 08/22/17   Clapacs, Jackquline Denmark, MD  naproxen (NAPROSYN) 500 MG tablet Take 1  tablet (500 mg total) by mouth 2 (two) times daily with a meal. 04/12/18   Eber Hong, MD  nitrofurantoin, macrocrystal-monohydrate, (MACROBID) 100 MG capsule Take 1 capsule (100 mg total) by mouth 2 (two) times daily. 04/14/19   Avegno, Zachery Dakins, FNP  ondansetron (ZOFRAN ODT) 4 MG disintegrating tablet 4mg  ODT q4 hours prn nausea/vomit 01/04/20   Mandeep Ferch, 03/05/20, MD  ondansetron (ZOFRAN) 4 MG tablet Take 1 tablet (4 mg total) by mouth every 8 (eight) hours as needed for nausea or vomiting. 04/14/19   Avegno, 04/16/19, FNP  promethazine (PHENERGAN) 25 MG suppository Place 1 suppository (25 mg total) rectally every 6 (six) hours as needed for nausea or vomiting. 01/04/20   Thia Olesen, 03/05/20, MD    Allergies    Coconut oil and Latex  Review of Systems   Review of Systems  Gastrointestinal: Positive for vomiting.  All other systems reviewed and are negative.   Physical Exam Updated Vital Signs BP 128/81   Pulse 85   Temp 97.8 F (36.6 C) (Oral)   Resp 18   Ht 5\' 1"  (1.549 m)   Wt 70.3 kg   LMP 12/18/2019   SpO2 97%   BMI 29.29 kg/m   Physical Exam Vitals and nursing note reviewed.  Constitutional:      Appearance:  She is well-developed.  HENT:     Head: Normocephalic and atraumatic.     Nose: No congestion or rhinorrhea.     Mouth/Throat:     Mouth: Mucous membranes are moist.     Pharynx: Oropharynx is clear.  Eyes:     Pupils: Pupils are equal, round, and reactive to light.  Cardiovascular:     Rate and Rhythm: Normal rate and regular rhythm.  Pulmonary:     Effort: No respiratory distress.     Breath sounds: No stridor.  Abdominal:     General: There is no distension.  Musculoskeletal:        General: No swelling or tenderness. Normal range of motion.     Cervical back: Normal range of motion.  Skin:    General: Skin is warm and dry.  Neurological:     General: No focal deficit present.     Mental Status: She is alert.     ED Results / Procedures / Treatments    Labs (all labs ordered are listed, but only abnormal results are displayed) Labs Reviewed  BASIC METABOLIC PANEL - Abnormal; Notable for the following components:      Result Value   Potassium 3.2 (*)    Glucose, Bld 107 (*)    All other components within normal limits  HCG, SERUM, QUALITATIVE    EKG None  Radiology No results found.  Procedures Procedures (including critical care time)  Medications Ordered in ED Medications  promethazine (PHENERGAN) injection 25 mg (25 mg Intramuscular Given 01/04/20 0426)    ED Course  I have reviewed the triage vital signs and the nursing notes.  Pertinent labs & imaging results that were available during my care of the patient were reviewed by me and considered in my medical decision making (see chart for details).    MDM Rules/Calculators/A&P                          Overall appears well. No focal abdominal findings. Tolerated PO. Observed for multiple hours without vomiting. Stable for discharge.   Final Clinical Impression(s) / ED Diagnoses Final diagnoses:  Non-intractable vomiting with nausea, unspecified vomiting type    Rx / DC Orders ED Discharge Orders         Ordered    promethazine (PHENERGAN) 25 MG suppository  Every 6 hours PRN        01/04/20 0611    ondansetron (ZOFRAN ODT) 4 MG disintegrating tablet        01/04/20 0611           Trinty Marken, Barbara Cower, MD 01/04/20 [phone]

## 2020-01-04 NOTE — ED Triage Notes (Addendum)
Pt c/o emesis since Saturday night. Pt states she has been unable to keep anything down.  Pt admits to taking subutex Saturday right before all of this started.

## 2020-01-04 NOTE — ED Notes (Signed)
Pt able to eat and drink 

## 2020-05-29 ENCOUNTER — Other Ambulatory Visit: Payer: Self-pay

## 2020-05-29 ENCOUNTER — Ambulatory Visit
Admission: EM | Admit: 2020-05-29 | Discharge: 2020-05-29 | Disposition: A | Discharge status: Home / Self Care | Payer: BC Managed Care – PPO | Attending: Internal Medicine | Admitting: Internal Medicine

## 2020-05-29 ENCOUNTER — Encounter: Payer: Self-pay | Admitting: Emergency Medicine

## 2020-05-29 DIAGNOSIS — R0789 Other chest pain: Secondary | ICD-10-CM

## 2020-05-29 NOTE — ED Triage Notes (Addendum)
Patient c/o right side pain that started 3 days ago. She denies any other symptoms. She is requesting a note for work.

## 2020-05-29 NOTE — Discharge Instructions (Addendum)
Gentle range of motion exercises is recommended Heating pad use is helpful Please take Tylenol as needed for pain Return to urgent care if symptoms worsen.

## 2020-05-31 NOTE — ED Provider Notes (Signed)
[facility]    CSN: [id number] Arrival date & time: 05/29/20  0947      History   Chief Complaint Chief Complaint  Patient presents with  . Muscle Pain    HPI Chloe Hopkins is a 25 y.o. female recently started on Diamox for what sounds like pseudotumor cerebri comes to urgent care complaining of right-sided chest pain of 3 days duration.  Patient describes the pain as sharp, on the right side of the chest, aggravated by movement and denies any known relieving factors.  No shortness of breath, cough or sputum production.  No recent long distance travel or calf pain.  No dizziness or near fainting.  Symptoms started a day or so after Diamox was started.  No fever or chills.  No upper respiratory infection symptoms. HPI  Past Medical History:  Diagnosis Date  . ADHD (attention deficit hyperactivity disorder)   . Anxiety   . Asthma   . Bipolar disorder (HCC)   . Depression   . History of fainting spells of unknown cause   . Migraine headache   . Seizures (HCC)    last one 2-3 years ago as of 12/18/18    Patient Active Problem List   Diagnosis Date Noted  . Self-inflicted laceration of wrist (HCC) 08/22/2017  . Bipolar disorder (HCC) 08/22/2017  . Adjustment disorder 08/22/2017    Past Surgical History:  Procedure Laterality Date  . CHOLECYSTECTOMY  2012  . TONSILLECTOMY AND ADENOIDECTOMY      OB History   No obstetric history on file.      Home Medications    Prior to Admission medications   Medication Sig Start Date End Date Taking? Authorizing Provider  acetaZOLAMIDE (DIAMOX) 250 MG tablet Take 250 mg by mouth 2 (two) times daily. 05/25/20  Yes [provider]  ARIPiprazole (ABILIFY) 5 MG tablet Take 1 tablet (5 mg total) by mouth daily. 08/22/17 05/29/20  Clapacs, Jackquline Denmark, MD  promethazine (PHENERGAN) 25 MG suppository Place 1 suppository (25 mg total) rectally every 6 (six) hours as needed for nausea or vomiting. 01/04/20 05/29/20  Mesner, Barbara Cower,  MD    Family History Family History  Problem Relation Age of Onset  . Diabetes Mother   . Bipolar disorder Father     Social History Social History   Tobacco Use  . Smoking status: Former Smoker    Packs/day: 1.00    Types: Cigarettes  . Smokeless tobacco: Never Used  Vaping Use  . Vaping Use: Every day  Substance Use Topics  . Alcohol use: Not Currently  . Drug use: Yes    Types: Marijuana     Allergies   Coconut oil and Latex   Review of Systems Review of Systems  HENT: Negative.   Respiratory: Negative for cough, chest tightness and shortness of breath.   Cardiovascular: Positive for chest pain. Negative for palpitations.  Gastrointestinal: Negative.   Genitourinary: Negative.   Musculoskeletal: Negative.      Physical Exam Triage Vital Signs ED Triage Vitals  Enc Vitals Group     BP 05/29/20 1041 120/87     Pulse Rate 05/29/20 1041 73     Resp 05/29/20 1041 18     Temp 05/29/20 1041 98.1 F (36.7 C)     Temp Source 05/29/20 1041 Oral     SpO2 05/29/20 1041 99 %     Weight --      Height 05/29/20 1039 5\' 1"  (1.549 m)     Head  Circumference --      Peak Flow --      Pain Score 05/29/20 1039 9     Pain Loc --      Pain Edu? --      Excl. in GC? --    No data found.  Updated Vital Signs BP 120/87 (BP Location: Left Arm)   Pulse 73   Temp 98.1 F (36.7 C) (Oral)   Resp 18   Ht 5\' 1"  (1.549 m)   LMP 05/28/2020   SpO2 99%   BMI 29.29 kg/m   Visual Acuity Right Eye Distance:   Left Eye Distance:   Bilateral Distance:    Right Eye Near:   Left Eye Near:    Bilateral Near:     Physical Exam Cardiovascular:     Rate and Rhythm: Normal rate and regular rhythm.     Pulses: Normal pulses.  Pulmonary:     Effort: Pulmonary effort is normal.     Comments: Tenderness on palpation over the lower side of the right chest wall Musculoskeletal:     Cervical back: Normal range of motion.      UC Treatments / Results  Labs (all labs  ordered are listed, but only abnormal results are displayed) Labs Reviewed - No data to display  EKG   Radiology No results found.  Procedures Procedures (including critical care time)  Medications Ordered in UC Medications - No data to display  Initial Impression / Assessment and Plan / UC Course  I have reviewed the triage vital signs and the nursing notes.  Pertinent labs & imaging results that were available during my care of the patient were reviewed by me and considered in my medical decision making (see chart for details).     1.  Chest wall pain likely side effect of Diamox: Patient is at low risk for cardiac event given that she does not have any risk factors and her clinical presentation is consistent with musculoskeletal chest wall pain. Tylenol as needed for pain Return precautions given. Final Clinical Impressions(s) / UC Diagnoses   Final diagnoses:  Chest wall pain     Discharge Instructions     Gentle range of motion exercises is recommended Heating pad use is helpful Please take Tylenol as needed for pain Return to urgent care if symptoms worsen.   ED Prescriptions    None     PDMP not reviewed this encounter.   07/28/2020, MD 05/31/20 1139

## 2020-08-15 ENCOUNTER — Emergency Department: Payer: BC Managed Care – PPO

## 2020-08-15 ENCOUNTER — Other Ambulatory Visit: Payer: Self-pay

## 2020-08-15 DIAGNOSIS — Z87891 Personal history of nicotine dependence: Secondary | ICD-10-CM | POA: Diagnosis not present

## 2020-08-15 DIAGNOSIS — J45909 Unspecified asthma, uncomplicated: Secondary | ICD-10-CM | POA: Insufficient documentation

## 2020-08-15 DIAGNOSIS — Z3202 Encounter for pregnancy test, result negative: Secondary | ICD-10-CM | POA: Insufficient documentation

## 2020-08-15 DIAGNOSIS — Z91040 Latex allergy status: Secondary | ICD-10-CM | POA: Insufficient documentation

## 2020-08-15 DIAGNOSIS — R0789 Other chest pain: Secondary | ICD-10-CM | POA: Insufficient documentation

## 2020-08-15 LAB — BASIC METABOLIC PANEL
Anion gap: 7 (ref 5–15)
BUN: 20 mg/dL (ref 6–20)
CO2: 20 mmol/L — ABNORMAL LOW (ref 22–32)
Calcium: 8.7 mg/dL — ABNORMAL LOW (ref 8.9–10.3)
Chloride: 113 mmol/L — ABNORMAL HIGH (ref 98–111)
Creatinine, Ser: 1.21 mg/dL — ABNORMAL HIGH (ref 0.44–1.00)
GFR, Estimated: >60 mL/min (ref >60)
Glucose, Bld: 95 mg/dL (ref 70–99)
Potassium: 3.5 mmol/L (ref 3.5–5.1)
Sodium: 140 mmol/L (ref 135–145)

## 2020-08-15 LAB — CBC
HCT: 43.5 % (ref 36.0–46.0)
Hemoglobin: 14.7 g/dL (ref 12.0–15.0)
MCH: 30.9 pg (ref 26.0–34.0)
MCHC: 33.8 g/dL (ref 30.0–36.0)
MCV: 91.4 fL (ref 80.0–100.0)
Platelets: 204 10*3/uL (ref 150–400)
RBC: 4.76 MIL/uL (ref 3.87–5.11)
RDW: 13 % (ref 11.5–15.5)
WBC: 12.6 10*3/uL — ABNORMAL HIGH (ref 4.0–10.5)
nRBC: 0 % (ref 0.0–0.2)

## 2020-08-15 LAB — TROPONIN I (HIGH SENSITIVITY): Troponin I (High Sensitivity): <2 ng/L (ref <18)

## 2020-08-15 NOTE — ED Triage Notes (Addendum)
Pt states she started havign chest pain earlier today and felt short of breath and then had 4 occurrences of emesis completley after. Pt denies any chest pain now. Pt also states she is concerned she is pregnant because she is 18 days late getting her period. 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

## 2020-08-16 ENCOUNTER — Emergency Department
Admission: EM | Admit: 2020-08-16 | Discharge: 2020-08-16 | Disposition: A | Discharge status: Home / Self Care | Payer: BC Managed Care – PPO | Attending: Emergency Medicine | Admitting: Emergency Medicine

## 2020-08-16 DIAGNOSIS — Z3202 Encounter for pregnancy test, result negative: Secondary | ICD-10-CM

## 2020-08-16 DIAGNOSIS — R0789 Other chest pain: Secondary | ICD-10-CM

## 2020-08-16 DIAGNOSIS — N926 Irregular menstruation, unspecified: Secondary | ICD-10-CM

## 2020-08-16 LAB — TROPONIN I (HIGH SENSITIVITY): Troponin I (High Sensitivity): <2 ng/L (ref <18)

## 2020-08-16 LAB — HCG, QUANTITATIVE, PREGNANCY: hCG, Beta Chain, Quant, S: <1 m[IU]/mL (ref <5)

## 2020-08-16 NOTE — ED Provider Notes (Signed)
[facility] Emergency Department Provider Note  ____________________________________________   Event Date/Time   First MD Initiated Contact with Patient 08/16/20 0128     (approximate)  I have reviewed the triage vital signs and the nursing notes.   HISTORY  Chief Complaint Chest Pain    HPI Chloe Hopkins is a 25 y.o. female with history of bipolar disorder, migraines who presents to the emergency department requesting a pregnancy test today.  She states that she has not had her period and it is 19 days overdue.  She is sexually active.  No previous pregnancies.  No current symptoms.  States she has had 12 negative pregnancy test at home and is requesting a blood pregnancy test or ultrasound here today.  No dysuria, hematuria, vaginal bleeding, discharge, abdominal pain.  Initially was also complaining of chest pain.  States this is not abnormal for her to have chest pain and states it feels like "the air knocked out of me".  She states this happened at 1:30 PM and improved by keeping her right arm over her head.  She states symptoms resolved by 7:30 PM.  No other aggravating or alleviating factors.  No fevers, cough, current chest pain, shortness of breath, nausea, vomiting, diarrhea.  No history of PE, DVT, exogenous estrogen use, recent fractures, surgery, trauma, hospitalization, prolonged travel or other immobilization. No lower extremity swelling or pain. No calf tenderness.         Past Medical History:  Diagnosis Date  . ADHD (attention deficit hyperactivity disorder)   . Anxiety   . Asthma   . Bipolar disorder (HCC)   . Depression   . History of fainting spells of unknown cause   . Migraine headache   . Seizures (HCC)    last one 2-3 years ago as of 12/18/18    Patient Active Problem List   Diagnosis Date Noted  . Self-inflicted laceration of wrist (HCC) 08/22/2017  . Bipolar disorder (HCC) 08/22/2017  . Adjustment disorder 08/22/2017     Past Surgical History:  Procedure Laterality Date  . CHOLECYSTECTOMY  2012  . TONSILLECTOMY AND ADENOIDECTOMY      Prior to Admission medications   Medication Sig Start Date End Date Taking? Authorizing Provider  acetaZOLAMIDE (DIAMOX) 250 MG tablet Take 250 mg by mouth 2 (two) times daily. 05/25/20   [provider]  ARIPiprazole (ABILIFY) 5 MG tablet Take 1 tablet (5 mg total) by mouth daily. 08/22/17 05/29/20  Clapacs, Jackquline Denmark, MD  promethazine (PHENERGAN) 25 MG suppository Place 1 suppository (25 mg total) rectally every 6 (six) hours as needed for nausea or vomiting. 01/04/20 05/29/20  Mesner, Barbara Cower, MD    Allergies Coconut oil and Latex  Family History  Problem Relation Age of Onset  . Diabetes Mother   . Bipolar disorder Father     Social History Social History   Tobacco Use  . Smoking status: Former Smoker    Packs/day: 1.00    Types: Cigarettes  . Smokeless tobacco: Never Used  Vaping Use  . Vaping Use: Every day  Substance Use Topics  . Alcohol use: Not Currently  . Drug use: Yes    Types: Marijuana    Review of Systems Constitutional: No fever. Eyes: No visual changes. ENT: No sore throat. Cardiovascular: Denies chest pain. Respiratory: Denies shortness of breath. Gastrointestinal: No nausea, vomiting, diarrhea. Genitourinary: Negative for dysuria. Musculoskeletal: Negative for back pain. Skin: Negative for rash. Neurological: Negative for focal weakness or numbness.  ____________________________________________  PHYSICAL EXAM:  VITAL SIGNS: ED Triage Vitals  Enc Vitals Group     BP 08/15/20 2133 (!) 137/91     Pulse Rate 08/15/20 2133 (!) 105     Resp 08/15/20 2133 18     Temp 08/15/20 2133 98.2 F (36.8 C)     Temp Source 08/15/20 2354 Oral     SpO2 08/15/20 2133 98 %     Weight 08/15/20 2128 160 lb (72.6 kg)     Height 08/15/20 2128 5\' 1"  (1.549 m)     Head Circumference --      Peak Flow --      Pain Score 08/15/20 2128 0      Pain Loc --      Pain Edu? --      Excl. in GC? --    CONSTITUTIONAL: Alert and oriented and responds appropriately to questions. Well-appearing; well-nourished HEAD: Normocephalic EYES: Conjunctivae clear, pupils appear equal, EOM appear intact ENT: normal nose; moist mucous membranes NECK: Supple, normal ROM CARD: RRR; S1 and S2 appreciated; no murmurs, no clicks, no rubs, no gallops RESP: Normal chest excursion without splinting or tachypnea; breath sounds clear and equal bilaterally; no wheezes, no rhonchi, no rales, no hypoxia or respiratory distress, speaking full sentences ABD/GI: Normal bowel sounds; non-distended; soft, non-tender, no rebound, no guarding, no peritoneal signs, no hepatosplenomegaly BACK: The back appears normal EXT: Normal ROM in all joints; no deformity noted, no edema; no cyanosis SKIN: Normal color for age and race; warm; no rash on exposed skin NEURO: Moves all extremities equally PSYCH: The patient's mood and manner are appropriate.  ____________________________________________   LABS (all labs ordered are listed, but only abnormal results are displayed)  Labs Reviewed  BASIC METABOLIC PANEL - Abnormal; Notable for the following components:      Result Value   Chloride 113 (*)    CO2 20 (*)    Creatinine, Ser 1.21 (*)    Calcium 8.7 (*)    All other components within normal limits  CBC - Abnormal; Notable for the following components:   WBC 12.6 (*)    All other components within normal limits  HCG, QUANTITATIVE, PREGNANCY  TROPONIN I (HIGH SENSITIVITY)  TROPONIN I (HIGH SENSITIVITY)   ____________________________________________  EKG   EKG Interpretation  Date/Time:  Tuesday Aug 15 2020 21:37:51 EDT Ventricular Rate:  85 PR Interval:  130 QRS Duration: 80 QT Interval:  342 QTC Calculation: 406 R Axis:   73 Text Interpretation: Normal sinus rhythm Normal ECG Confirmed by 08[birth year]29[birth year]1969 [phone]) on 08/16/2020 1:40:54 AM        ____________________________________________  RADIOLOGY 08/18/2020 Kandis Henry, personally viewed and evaluated these images (plain radiographs) as part of my medical decision making, as well as reviewing the written report by the radiologist.  ED MD interpretation: Chest x-ray clear.  Official radiology report(s): DG Chest 2 View  Result Date: 08/15/2020 CLINICAL DATA:  Chest pain EXAM: CHEST - 2 VIEW COMPARISON:  None. FINDINGS: The heart size and mediastinal contours are within normal limits. Both lungs are clear. The visualized skeletal structures are unremarkable. IMPRESSION: No active cardiopulmonary disease. Electronically Signed   By: 08/17/2020 MD   On: 08/15/2020 21:50    ____________________________________________   PROCEDURES  Procedure(s) performed (including Critical Care):  Procedures    ____________________________________________   INITIAL IMPRESSION / ASSESSMENT AND PLAN / ED COURSE  As part of my medical decision making, I reviewed the following data within the electronic MEDICAL RECORD NUMBER  Nursing notes reviewed and incorporated, Labs reviewed , EKG interpreted , Old EKG reviewed, Old chart reviewed, Radiograph reviewed  and Notes from prior ED visits         Patient here requesting a pregnancy test.  This seems to be the real reason she is in the emergency department.  She seems irritated when I question her regarding her chest pain stating that is now gone.  Seem very atypical in nature.  She has had 2 normal troponins, normal EKG and a clear chest x-ray.  No signs or symptoms of pneumonia, volume overload, pneumothorax.  Doubt ACS.  She is PERC negative.  Pregnancy test obtained shows that she has a negative hCG.  Patient states she does not understand why she is not having her menstrual cycle.  Discussed with her that this can be followed as an outpatient.  Her abdominal exam is benign.  At this time, I do not feel there is any life-threatening condition  present. I have reviewed, interpreted and discussed all results (EKG, imaging, lab, urine as appropriate) and exam findings with patient/family. I have reviewed nursing notes and appropriate previous records.  I feel the patient is safe to be discharged home without further emergent workup and can continue workup as an outpatient as needed. Discussed usual and customary return precautions. Patient/family verbalize understanding and are comfortable with this plan.  Outpatient follow-up has been provided as needed. All questions have been answered.  ____________________________________________   FINAL CLINICAL IMPRESSION(S) / ED DIAGNOSES  Final diagnoses:  Atypical chest pain  Missed period  Negative pregnancy test     ED Discharge Orders    None      *Please note:  Canda Morson was evaluated in Emergency Department on 08/16/2020 for the symptoms described in the history of present illness. She was evaluated in the context of the global COVID-19 pandemic, which necessitated consideration that the patient might be at risk for infection with the SARS-CoV-2 virus that causes COVID-19. Institutional protocols and algorithms that pertain to the evaluation of patients at risk for COVID-19 are in a state of rapid change based on information released by regulatory bodies including the CDC and federal and state organizations. These policies and algorithms were followed during the patient's care in the ED.  Some ED evaluations and interventions may be delayed as a result of limited staffing during and the pandemic.*   Note:  This document was prepared using Dragon voice recognition software and may include unintentional dictation errors.   Dorreen Valiente, Layla Maw, DO 08/16/20 [phone]

## 2020-08-16 NOTE — Discharge Instructions (Signed)
Your pregnancy test today was negative.  I recommend that you recheck a pregnancy test in 2 weeks if you still have not had your menstrual cycle.  You may follow-up with your primary care doctor as needed.

## 2020-08-16 NOTE — ED Notes (Signed)
PATIENT STATES POC URINE PREG WILL NOT SHOW WHETHER SHE IS PREGNANT, SHE TOOK 12 PREGNANCY TEST AT HOME AND WERE ALL NEGATIVE, STATES SHE REQUESTED BLOOD PREGNANCY TEST IN TRIAGE, STATES SHE HAS NOT HAD ANYTHING TO EAT OR DRINK IN 4 HOURS AND SHE CANNOT VOID

## 2020-10-06 ENCOUNTER — Other Ambulatory Visit: Payer: Self-pay | Admitting: Neurology

## 2020-10-06 DIAGNOSIS — G932 Benign intracranial hypertension: Secondary | ICD-10-CM

## 2020-10-12 ENCOUNTER — Ambulatory Visit
Admission: RE | Admit: 2020-10-12 | Discharge: 2020-10-12 | Disposition: A | Discharge status: Home / Self Care | Payer: BC Managed Care – PPO | Source: Ambulatory Visit | Attending: Neurology | Admitting: Neurology

## 2020-10-12 ENCOUNTER — Other Ambulatory Visit: Payer: Self-pay

## 2020-10-12 DIAGNOSIS — G932 Benign intracranial hypertension: Secondary | ICD-10-CM

## 2020-12-13 ENCOUNTER — Other Ambulatory Visit: Payer: Self-pay | Admitting: Neurology

## 2020-12-13 DIAGNOSIS — G932 Benign intracranial hypertension: Secondary | ICD-10-CM

## 2020-12-21 ENCOUNTER — Ambulatory Visit: Payer: BC Managed Care – PPO

## 2020-12-28 ENCOUNTER — Other Ambulatory Visit: Payer: Self-pay

## 2020-12-28 ENCOUNTER — Ambulatory Visit
Admission: RE | Admit: 2020-12-28 | Discharge: 2020-12-28 | Disposition: A | Discharge status: Home / Self Care | Payer: BC Managed Care – PPO | Source: Ambulatory Visit | Attending: Neurology | Admitting: Neurology

## 2020-12-28 DIAGNOSIS — G932 Benign intracranial hypertension: Secondary | ICD-10-CM | POA: Insufficient documentation

## 2020-12-28 LAB — PREGNANCY, URINE: Preg Test, Ur: NEGATIVE

## 2020-12-28 MED ORDER — LIDOCAINE HCL (PF) 1 % IJ SOLN
5.0000 mL | Freq: Once | INTRAMUSCULAR | Status: AC
  Administered 2020-12-28: 5 mL
  Filled 2020-12-28: qty 5

## 2020-12-28 MED ORDER — ACETAMINOPHEN 325 MG PO TABS
650.0000 mg | ORAL_TABLET | ORAL | Status: DC | PRN
  Filled 2020-12-28: qty 2

## 2020-12-28 NOTE — Progress Notes (Signed)
Informed patient her recovery time would be 2 hours and to lay flat for remainder of the day. Patient states "well im leaving, I can lay flat at home" I advised her that the doctor wants her to stay here for 2hours to observe for any complications she states "i'm leaving"  dr. Welton Flakes informed of the above, AMA note signed by patient. Instructed patient to lay flat remainder of the day.  Patient refused work note and also AVS recommended transport to car in wheelchair but patient again refused

## 2020-12-28 NOTE — Procedures (Signed)
PROCEDURE SUMMARY:  Successful fluoroscopic lumbar puncture.  Opening pressure 25cm of H20 Removal of 20mL of colorless CSF. Closing pressure 15cm of H20. No immediate complications. Patient denied any headaches or change in baseline throughout the procedure.  Pt tolerated well.   EBL N/A  Berneta Levins PA-C 12/28/2020 10:35 AM

## 2021-01-05 ENCOUNTER — Emergency Department (HOSPITAL_COMMUNITY)
Admission: EM | Admit: 2021-01-05 | Discharge: 2021-01-05 | Disposition: A | Discharge status: Home / Self Care | Payer: BC Managed Care – PPO | Attending: Emergency Medicine | Admitting: Emergency Medicine

## 2021-01-05 ENCOUNTER — Encounter (HOSPITAL_COMMUNITY): Payer: Self-pay

## 2021-01-05 ENCOUNTER — Other Ambulatory Visit: Payer: Self-pay

## 2021-01-05 DIAGNOSIS — J45909 Unspecified asthma, uncomplicated: Secondary | ICD-10-CM | POA: Diagnosis not present

## 2021-01-05 DIAGNOSIS — G971 Other reaction to spinal and lumbar puncture: Secondary | ICD-10-CM | POA: Insufficient documentation

## 2021-01-05 DIAGNOSIS — Z87891 Personal history of nicotine dependence: Secondary | ICD-10-CM | POA: Insufficient documentation

## 2021-01-05 DIAGNOSIS — Z91040 Latex allergy status: Secondary | ICD-10-CM | POA: Diagnosis not present

## 2021-01-05 DIAGNOSIS — R519 Headache, unspecified: Secondary | ICD-10-CM | POA: Diagnosis present

## 2021-01-05 MED ORDER — PROCHLORPERAZINE EDISYLATE 10 MG/2ML IJ SOLN
10.0000 mg | Freq: Once | INTRAMUSCULAR | Status: AC
  Administered 2021-01-05: 10 mg via INTRAVENOUS
  Filled 2021-01-05: qty 2

## 2021-01-05 MED ORDER — SODIUM CHLORIDE 0.9 % IV BOLUS (SEPSIS)
1000.0000 mL | Freq: Once | INTRAVENOUS | Status: AC
  Administered 2021-01-05: 1000 mL via INTRAVENOUS

## 2021-01-05 MED ORDER — KETOROLAC TROMETHAMINE 30 MG/ML IJ SOLN
30.0000 mg | Freq: Once | INTRAMUSCULAR | Status: AC
  Administered 2021-01-05: 30 mg via INTRAVENOUS
  Filled 2021-01-05: qty 1

## 2021-01-05 NOTE — Discharge Instructions (Addendum)
Please call your neurologist to help arrange a blood patch

## 2021-01-05 NOTE — ED Triage Notes (Addendum)
Pt states that she had a LP on the 6th of this month in order to relieve pressure of cerebral tumor. Pt is complaining of severe headache that has gotten worse since procedure. Pt states she got dizzy today. Denies any LOC.

## 2021-01-05 NOTE — ED Provider Notes (Signed)
[facility] EMERGENCY DEPARTMENT Provider Note   CSN: [id number] Arrival date & time: 01/05/21  0249     History Chief Complaint  Patient presents with   Headache    Chloe Hopkins is a 25 y.o. female.  The history is provided by the patient.  Headache Pain location:  Generalized Onset quality:  Gradual Duration:  1 week Timing:  Constant Progression:  Worsening Chronicity:  New Relieved by: Rest and lying flat. Worsened by:  Activity Associated symptoms: dizziness   Associated symptoms: no fever, no focal weakness, no neck stiffness, no vomiting and no weakness   Patient with known history of anxiety, asthma, depression, pseudotumor/IIH presents with headache.  Patient underwent a therapeutic lumbar puncture on October 6 for her pseudotumor cerebri.  She did not have a headache prior to the procedure.  She reports the following day she began having headaches that are worse with standing up.  No fevers or vomiting.  No neck stiffness.  She does report dizziness. She has had multiple lumbar punctures in the past.  She has required blood patch previously    Past Medical History:  Diagnosis Date   ADHD (attention deficit hyperactivity disorder)    Anxiety    Asthma    Bipolar disorder (HCC)    Depression    History of fainting spells of unknown cause    Migraine headache    Seizures (HCC)    last one 2-3 years ago as of 12/18/18    Patient Active Problem List   Diagnosis Date Noted   Self-inflicted laceration of wrist (HCC) 08/22/2017   Bipolar disorder (HCC) 08/22/2017   Adjustment disorder 08/22/2017    Past Surgical History:  Procedure Laterality Date   CHOLECYSTECTOMY  2012   TONSILLECTOMY AND ADENOIDECTOMY       OB History   No obstetric history on file.     Family History  Problem Relation Age of Onset   Diabetes Mother    Bipolar disorder Father     Social History   Tobacco Use   Smoking status: Former    Packs/day: 1.00    Types:  Cigarettes   Smokeless tobacco: Never  Vaping Use   Vaping Use: Every day  Substance Use Topics   Alcohol use: Not Currently   Drug use: Yes    Types: Marijuana    Home Medications Prior to Admission medications   Medication Sig Start Date End Date Taking? Authorizing Provider  acetaZOLAMIDE (DIAMOX) 250 MG tablet Take 250 mg by mouth 2 (two) times daily. 05/25/20   [provider]  ARIPiprazole (ABILIFY) 5 MG tablet Take 1 tablet (5 mg total) by mouth daily. 08/22/17 05/29/20  Clapacs, Jackquline Denmark, MD  promethazine (PHENERGAN) 25 MG suppository Place 1 suppository (25 mg total) rectally every 6 (six) hours as needed for nausea or vomiting. 01/04/20 05/29/20  Mesner, Barbara Cower, MD    Allergies    Coconut oil and Latex  Review of Systems   Review of Systems  Constitutional:  Negative for fever.  Eyes:        Denies new visual changes  Cardiovascular:  Negative for chest pain.  Gastrointestinal:  Negative for vomiting.  Musculoskeletal:  Negative for neck stiffness.  Neurological:  Positive for dizziness and headaches. Negative for focal weakness, syncope and weakness.  All other systems reviewed and are negative.  Physical Exam Updated Vital Signs BP 115/73   Pulse 96   Temp 98.6 F (37 C) (Oral)   Resp 18  Ht 1.549 m (5\' 1" )   Wt 68.5 kg   LMP 11/22/2020 Comment: neg preg  SpO2 96%   BMI 28.53 kg/m   Physical Exam CONSTITUTIONAL: Well developed/well nourished HEAD: Normocephalic/atraumatic EYES: EOMI/PERRL, no nystagmus, no ptosis, no obvious papilledema bilaterally on limited funduscopic exam ENMT: Mucous membranes moist NECK: supple no meningeal signs, no bruits SPINE/BACK:entire spine nontender No rash, no erythema, no swelling to spine CV: S1/S2 noted, no murmurs/rubs/gallops noted LUNGS: Lungs are clear to auscultation bilaterally, no apparent distress ABDOMEN: soft, nontender, no rebound or guarding GU:no cva tenderness NEURO:Awake/alert, face symmetric, no  arm or leg drift is noted Equal 5/5 strength with shoulder abduction, elbow flex/extension, wrist flex/extension in upper extremities and equal hand grips bilaterally Equal 5/5 strength with hip flexion,knee flex/extension, foot dorsi/plantar flexion Cranial nerves 3/4/5/6/09/30/08/11/12 tested and intact Gait normal without ataxia No past pointing Sensation to light touch intact in all extremities EXTREMITIES: pulses normal, full ROM SKIN: warm, color normal PSYCH: no abnormalities of mood noted, alert and oriented to situation  ED Results / Procedures / Treatments   Labs (all labs ordered are listed, but only abnormal results are displayed) Labs Reviewed - No data to display  EKG None  Radiology No results found.  Procedures Procedures   Medications Ordered in ED Medications  sodium chloride 0.9 % bolus 1,000 mL (1,000 mLs Intravenous New Bag/Given 01/05/21 0456)  prochlorperazine (COMPAZINE) injection 10 mg (10 mg Intravenous Given 01/05/21 0456)  ketorolac (TORADOL) 30 MG/ML injection 30 mg (30 mg Intravenous Given 01/05/21 0455)    ED Course  I have reviewed the triage vital signs and the nursing notes.      MDM Rules/Calculators/A&P                           Patient reports persistent post LP headache since undergoing the procedure on October 6.  Patient reports daily compliance with Diamox 3 times daily. She reports that headache did not start until after the procedure.  Patient is afebrile, no neck stiffness no signs of meningitis.  She reports chronic visual changes which include loss of peripheral vision, but there is no acute changes recently. There is no focal weakness and she is ambulatory. Patient is requesting meds for headache relief and then will follow-up with her neurologist.  She may benefit from outpatient blood patch  Final Clinical Impression(s) / ED Diagnoses Final diagnoses:  Post lumbar puncture headache    Rx / DC Orders ED Discharge  Orders     None        07-17-2002, MD 01/05/21 [phone]

## 2021-01-09 ENCOUNTER — Other Ambulatory Visit: Payer: Self-pay | Admitting: Neurology

## 2021-01-09 DIAGNOSIS — G932 Benign intracranial hypertension: Secondary | ICD-10-CM

## 2021-01-09 DIAGNOSIS — H4710 Unspecified papilledema: Secondary | ICD-10-CM

## 2021-01-24 ENCOUNTER — Other Ambulatory Visit: Payer: Self-pay | Admitting: Neurology

## 2021-01-24 DIAGNOSIS — H4710 Unspecified papilledema: Secondary | ICD-10-CM

## 2021-01-24 DIAGNOSIS — H53149 Visual discomfort, unspecified: Secondary | ICD-10-CM

## 2021-01-24 DIAGNOSIS — H53453 Other localized visual field defect, bilateral: Secondary | ICD-10-CM

## 2021-01-24 DIAGNOSIS — G932 Benign intracranial hypertension: Secondary | ICD-10-CM

## 2021-01-29 ENCOUNTER — Other Ambulatory Visit: Payer: Self-pay

## 2021-01-29 ENCOUNTER — Ambulatory Visit
Admission: RE | Admit: 2021-01-29 | Discharge: 2021-01-29 | Disposition: A | Discharge status: Home / Self Care | Payer: BC Managed Care – PPO | Source: Ambulatory Visit | Attending: Neurology | Admitting: Neurology

## 2021-01-29 DIAGNOSIS — G932 Benign intracranial hypertension: Secondary | ICD-10-CM

## 2021-01-29 DIAGNOSIS — H53149 Visual discomfort, unspecified: Secondary | ICD-10-CM

## 2021-01-29 DIAGNOSIS — H53453 Other localized visual field defect, bilateral: Secondary | ICD-10-CM

## 2021-01-29 DIAGNOSIS — H4710 Unspecified papilledema: Secondary | ICD-10-CM

## 2021-02-05 NOTE — Progress Notes (Signed)
Patient for periodic Q 6 week LP 02/08/2021, called and spoke with patient on phone with pre procedure instructions given. Made aware to be here @ 0800, and driver post procedure/recovery/discharge.stated understanding.

## 2021-02-08 ENCOUNTER — Other Ambulatory Visit: Payer: Self-pay

## 2021-02-08 ENCOUNTER — Ambulatory Visit
Admission: RE | Admit: 2021-02-08 | Discharge: 2021-02-08 | Disposition: A | Discharge status: Home / Self Care | Payer: BC Managed Care – PPO | Source: Ambulatory Visit | Attending: Neurology | Admitting: Neurology

## 2021-02-08 DIAGNOSIS — H4710 Unspecified papilledema: Secondary | ICD-10-CM | POA: Diagnosis present

## 2021-02-08 DIAGNOSIS — G932 Benign intracranial hypertension: Secondary | ICD-10-CM | POA: Insufficient documentation

## 2021-02-08 LAB — PREGNANCY, URINE: Preg Test, Ur: NEGATIVE

## 2021-02-08 MED ORDER — LIDOCAINE HCL (PF) 1 % IJ SOLN
10.0000 mL | Freq: Once | INTRAMUSCULAR | Status: AC
  Administered 2021-02-08: 09:00:00 5 mL
  Filled 2021-02-08: qty 10

## 2021-02-08 NOTE — Progress Notes (Signed)
Patient returned to [facility] and passed off to [employer] with chart.

## 2021-02-08 NOTE — Procedures (Signed)
PROCEDURE SUMMARY:  Successful fluoroscopic guided lumbar puncture Opening pressure 29cm of H2O  Removal of 20 mL of colorless CSF fluid Closing pressure of 15cm of H20  No immediate complications. Patient denied any worsening headaches or change in baseline throughout the procedure.  Pt tolerated well.   Specimen was not sent for labs.  EBL N/A  Cloretta Ned 02/08/2021 9:26 AM

## 2021-12-13 ENCOUNTER — Emergency Department (HOSPITAL_COMMUNITY)
Admission: EM | Admit: 2021-12-13 | Discharge: 2021-12-13 | Disposition: A | Discharge status: Home / Self Care | Payer: Worker's Compensation | Attending: Emergency Medicine | Admitting: Emergency Medicine

## 2021-12-13 ENCOUNTER — Encounter (HOSPITAL_COMMUNITY): Payer: Self-pay

## 2021-12-13 ENCOUNTER — Other Ambulatory Visit: Payer: Self-pay

## 2021-12-13 DIAGNOSIS — S62639A Displaced fracture of distal phalanx of unspecified finger, initial encounter for closed fracture: Secondary | ICD-10-CM

## 2021-12-13 DIAGNOSIS — Z91040 Latex allergy status: Secondary | ICD-10-CM | POA: Insufficient documentation

## 2021-12-13 DIAGNOSIS — W231XXA Caught, crushed, jammed, or pinched between stationary objects, initial encounter: Secondary | ICD-10-CM | POA: Diagnosis not present

## 2021-12-13 DIAGNOSIS — Z23 Encounter for immunization: Secondary | ICD-10-CM | POA: Insufficient documentation

## 2021-12-13 DIAGNOSIS — S62636A Displaced fracture of distal phalanx of right little finger, initial encounter for closed fracture: Secondary | ICD-10-CM | POA: Insufficient documentation

## 2021-12-13 DIAGNOSIS — S61216A Laceration without foreign body of right little finger without damage to nail, initial encounter: Secondary | ICD-10-CM | POA: Insufficient documentation

## 2021-12-13 DIAGNOSIS — S6991XA Unspecified injury of right wrist, hand and finger(s), initial encounter: Secondary | ICD-10-CM | POA: Diagnosis present

## 2021-12-13 MED ORDER — TETANUS-DIPHTH-ACELL PERTUSSIS 5-2.5-18.5 LF-MCG/0.5 IM SUSY
0.5000 mL | PREFILLED_SYRINGE | Freq: Once | INTRAMUSCULAR | Status: AC
Start: 2021-12-13 — End: 2021-12-13
  Administered 2021-12-13: 0.5 mL via INTRAMUSCULAR
  Filled 2021-12-13: qty 0.5

## 2021-12-13 MED ORDER — LIDOCAINE HCL (PF) 1 % IJ SOLN
10.0000 mL | Freq: Once | INTRAMUSCULAR | Status: AC
Start: 2021-12-13 — End: 2021-12-13
  Administered 2021-12-13: 10 mL
  Filled 2021-12-13: qty 10

## 2021-12-13 NOTE — ED Triage Notes (Signed)
Pt to er, pt states that she was at work and her finger got pinched in a sliding door, pt has dressing in placed on her R pinky.  States that she went to urgent care and they sent her to the er to get stitches.

## 2021-12-13 NOTE — Discharge Instructions (Signed)
Would like for you to follow-up with hand surgery sometime next week to assess the fractures.  You will need the sutures out in 7 to 10 days.  You can get them removed at any urgent care, your primary care doctor, or the emergency department.  I would stick with ibuprofen for pain.  You can take 600 mg every 6 hours.  Please return to the emergency department for any worsening symptoms you might have.
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
## 2022-09-01 ENCOUNTER — Encounter (HOSPITAL_COMMUNITY): Payer: Self-pay | Admitting: Emergency Medicine

## 2022-09-01 ENCOUNTER — Emergency Department (HOSPITAL_COMMUNITY): Payer: BC Managed Care – PPO

## 2022-09-01 ENCOUNTER — Other Ambulatory Visit: Payer: Self-pay

## 2022-09-01 ENCOUNTER — Emergency Department (HOSPITAL_COMMUNITY)
Admission: EM | Admit: 2022-09-01 | Discharge: 2022-09-01 | Disposition: A | Discharge status: Home / Self Care | Payer: BC Managed Care – PPO | Attending: Emergency Medicine | Admitting: Emergency Medicine

## 2022-09-01 DIAGNOSIS — S40812A Abrasion of left upper arm, initial encounter: Secondary | ICD-10-CM

## 2022-09-01 DIAGNOSIS — S060X9A Concussion with loss of consciousness of unspecified duration, initial encounter: Secondary | ICD-10-CM

## 2022-09-01 DIAGNOSIS — S80212A Abrasion, left knee, initial encounter: Secondary | ICD-10-CM | POA: Diagnosis not present

## 2022-09-01 DIAGNOSIS — R7309 Other abnormal glucose: Secondary | ICD-10-CM | POA: Insufficient documentation

## 2022-09-01 DIAGNOSIS — Y9355 Activity, bike riding: Secondary | ICD-10-CM | POA: Insufficient documentation

## 2022-09-01 DIAGNOSIS — S0990XA Unspecified injury of head, initial encounter: Secondary | ICD-10-CM | POA: Diagnosis present

## 2022-09-01 DIAGNOSIS — S060XAA Concussion with loss of consciousness status unknown, initial encounter: Secondary | ICD-10-CM | POA: Diagnosis not present

## 2022-09-01 DIAGNOSIS — Z79899 Other long term (current) drug therapy: Secondary | ICD-10-CM | POA: Diagnosis not present

## 2022-09-01 LAB — COMPREHENSIVE METABOLIC PANEL
ALT: 25 U/L (ref 0–44)
AST: 24 U/L (ref 15–41)
Albumin: 4.2 g/dL (ref 3.5–5.0)
Alkaline Phosphatase: 67 U/L (ref 38–126)
Anion gap: 10 (ref 5–15)
BUN: 11 mg/dL (ref 6–20)
CO2: 22 mmol/L (ref 22–32)
Calcium: 8.8 mg/dL — ABNORMAL LOW (ref 8.9–10.3)
Chloride: 103 mmol/L (ref 98–111)
Creatinine, Ser: 0.73 mg/dL (ref 0.44–1.00)
GFR, Estimated: >60 mL/min (ref >60)
Glucose, Bld: 105 mg/dL — ABNORMAL HIGH (ref 70–99)
Potassium: 4 mmol/L (ref 3.5–5.1)
Sodium: 135 mmol/L (ref 135–145)
Total Bilirubin: 0.5 mg/dL (ref 0.3–1.2)
Total Protein: 7.6 g/dL (ref 6.5–8.1)

## 2022-09-01 LAB — CBC WITH DIFFERENTIAL/PLATELET
Abs Immature Granulocytes: 0.04 10*3/uL (ref 0.00–0.07)
Basophils Absolute: 0 10*3/uL (ref 0.0–0.1)
Basophils Relative: 0 %
Eosinophils Absolute: 0.1 10*3/uL (ref 0.0–0.5)
Eosinophils Relative: 1 %
HCT: 44.7 % (ref 36.0–46.0)
Hemoglobin: 15.7 g/dL — ABNORMAL HIGH (ref 12.0–15.0)
Immature Granulocytes: 0 %
Lymphocytes Relative: 23 %
Lymphs Abs: 2.6 10*3/uL (ref 0.7–4.0)
MCH: 31.7 pg (ref 26.0–34.0)
MCHC: 35.1 g/dL (ref 30.0–36.0)
MCV: 90.1 fL (ref 80.0–100.0)
Monocytes Absolute: 0.9 10*3/uL (ref 0.1–1.0)
Monocytes Relative: 8 %
Neutro Abs: 7.6 10*3/uL (ref 1.7–7.7)
Neutrophils Relative %: 68 %
Platelets: 269 10*3/uL (ref 150–400)
RBC: 4.96 MIL/uL (ref 3.87–5.11)
RDW: 12.7 % (ref 11.5–15.5)
WBC: 11.2 10*3/uL — ABNORMAL HIGH (ref 4.0–10.5)
nRBC: 0 % (ref 0.0–0.2)

## 2022-09-01 LAB — TYPE AND SCREEN
ABO/RH(D): AB NEG
Antibody Screen: NEGATIVE

## 2022-09-01 LAB — ETHANOL: Alcohol, Ethyl (B): <10 mg/dL (ref <10)

## 2022-09-01 LAB — LACTIC ACID, PLASMA: Lactic Acid, Venous: 1.9 mmol/L (ref 0.5–1.9)

## 2022-09-01 LAB — CBG MONITORING, ED: Glucose-Capillary: 112 mg/dL — ABNORMAL HIGH (ref 70–99)

## 2022-09-01 LAB — POC URINE PREG, ED: Preg Test, Ur: NEGATIVE

## 2022-09-01 MED ORDER — ACETAMINOPHEN 325 MG PO TABS: 650.0000 mg | ORAL_TABLET | Freq: Once | ORAL | Status: DC

## 2022-09-01 MED ORDER — TETANUS-DIPHTH-ACELL PERTUSSIS 5-2.5-18.5 LF-MCG/0.5 IM SUSY: 0.5000 mL | PREFILLED_SYRINGE | Freq: Once | INTRAMUSCULAR | Status: DC

## 2022-09-01 MED ORDER — ONDANSETRON HCL 4 MG/2ML IJ SOLN
4.0000 mg | Freq: Once | INTRAMUSCULAR | Status: AC
  Administered 2022-09-01: 4 mg via INTRAVENOUS
  Filled 2022-09-01: qty 2

## 2022-09-01 MED ORDER — IOHEXOL 300 MG/ML  SOLN
100.0000 mL | Freq: Once | INTRAMUSCULAR | Status: AC | PRN
  Administered 2022-09-01: 100 mL via INTRAVENOUS
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
## 2022-09-01 NOTE — ED Notes (Signed)
Pregnancy tests negative

## 2023-10-04 ENCOUNTER — Encounter (HOSPITAL_COMMUNITY): Payer: Self-pay

## 2023-10-04 ENCOUNTER — Other Ambulatory Visit: Payer: Self-pay

## 2023-10-04 ENCOUNTER — Emergency Department (HOSPITAL_COMMUNITY)
Admission: EM | Admit: 2023-10-04 | Discharge: 2023-10-04 | Disposition: A | Discharge status: Home / Self Care | Payer: Self-pay | Attending: Emergency Medicine | Admitting: Emergency Medicine

## 2023-10-04 DIAGNOSIS — S0012XA Contusion of left eyelid and periocular area, initial encounter: Secondary | ICD-10-CM | POA: Diagnosis not present

## 2023-10-04 DIAGNOSIS — S0083XA Contusion of other part of head, initial encounter: Secondary | ICD-10-CM | POA: Insufficient documentation

## 2023-10-04 DIAGNOSIS — H02846 Edema of left eye, unspecified eyelid: Secondary | ICD-10-CM | POA: Diagnosis present

## 2023-10-04 DIAGNOSIS — S2001XA Contusion of right breast, initial encounter: Secondary | ICD-10-CM | POA: Insufficient documentation

## 2023-10-04 DIAGNOSIS — Y92410 Unspecified street and highway as the place of occurrence of the external cause: Secondary | ICD-10-CM | POA: Insufficient documentation
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
## 2024-02-11 ENCOUNTER — Emergency Department
Admission: EM | Admit: 2024-02-11 | Discharge: 2024-02-11 | Disposition: A | Discharge status: Home / Self Care | Payer: Self-pay | Attending: Emergency Medicine | Admitting: Emergency Medicine

## 2024-02-11 ENCOUNTER — Other Ambulatory Visit: Payer: Self-pay

## 2024-02-11 DIAGNOSIS — S0011XA Contusion of right eyelid and periocular area, initial encounter: Secondary | ICD-10-CM | POA: Insufficient documentation

## 2024-02-11 DIAGNOSIS — W208XXA Other cause of strike by thrown, projected or falling object, initial encounter: Secondary | ICD-10-CM | POA: Insufficient documentation

## 2024-02-11 DIAGNOSIS — S0591XA Unspecified injury of right eye and orbit, initial encounter: Secondary | ICD-10-CM

## 2024-02-11 DIAGNOSIS — Y990 Civilian activity done for income or pay: Secondary | ICD-10-CM | POA: Insufficient documentation
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
